# Patient Record
Sex: Female | Born: 1975 | Race: White | Hispanic: No | Marital: Married | Smoking: Never smoker
Health system: Southern US, Community
[De-identification: ages and names within clinical notes are randomized; demographics above are authoritative.]

## PROBLEM LIST (undated history)

## (undated) DIAGNOSIS — O43219 Placenta accreta, unspecified trimester: Secondary | ICD-10-CM

## (undated) DIAGNOSIS — O139 Gestational [pregnancy-induced] hypertension without significant proteinuria, unspecified trimester: Secondary | ICD-10-CM

## (undated) DIAGNOSIS — F419 Anxiety disorder, unspecified: Secondary | ICD-10-CM

## (undated) DIAGNOSIS — I341 Nonrheumatic mitral (valve) prolapse: Secondary | ICD-10-CM

## (undated) DIAGNOSIS — R011 Cardiac murmur, unspecified: Secondary | ICD-10-CM

## (undated) DIAGNOSIS — G039 Meningitis, unspecified: Secondary | ICD-10-CM

## (undated) DIAGNOSIS — S0990XA Unspecified injury of head, initial encounter: Secondary | ICD-10-CM

## (undated) DIAGNOSIS — N200 Calculus of kidney: Secondary | ICD-10-CM

## (undated) DIAGNOSIS — O1490 Unspecified pre-eclampsia, unspecified trimester: Secondary | ICD-10-CM

## (undated) DIAGNOSIS — R42 Dizziness and giddiness: Secondary | ICD-10-CM

## (undated) DIAGNOSIS — N840 Polyp of corpus uteri: Secondary | ICD-10-CM

## (undated) HISTORY — DX: Unspecified injury of head, initial encounter: S09.90XA

## (undated) HISTORY — DX: Unspecified pre-eclampsia, unspecified trimester: O14.90

## (undated) HISTORY — DX: Meningitis, unspecified: G03.9

## (undated) HISTORY — DX: Gestational (pregnancy-induced) hypertension without significant proteinuria, unspecified trimester: O13.9

## (undated) HISTORY — DX: Anxiety disorder, unspecified: F41.9

## (undated) HISTORY — DX: Calculus of kidney: N20.0

## (undated) HISTORY — PX: NO PAST SURGERIES: SHX2092

## (undated) HISTORY — DX: Nonrheumatic mitral (valve) prolapse: I34.1

## (undated) HISTORY — PX: OTHER SURGICAL HISTORY: SHX169

---

## 2000-06-14 ENCOUNTER — Ambulatory Visit: Admission: RE | Admit: 2000-06-14 | Discharge: 2000-06-14 | Payer: Self-pay | Admitting: Family Medicine

## 2003-02-19 ENCOUNTER — Encounter: Admission: RE | Admit: 2003-02-19 | Discharge: 2003-02-19 | Payer: Self-pay | Admitting: Family Medicine

## 2003-02-19 ENCOUNTER — Encounter: Payer: Self-pay | Admitting: Family Medicine

## 2004-12-16 ENCOUNTER — Other Ambulatory Visit: Admission: RE | Admit: 2004-12-16 | Discharge: 2004-12-16 | Payer: Self-pay | Admitting: Family Medicine

## 2005-01-18 ENCOUNTER — Emergency Department (HOSPITAL_COMMUNITY): Admission: EM | Admit: 2005-01-18 | Discharge: 2005-01-18 | Payer: Self-pay | Admitting: Emergency Medicine

## 2005-02-13 ENCOUNTER — Encounter: Admission: RE | Admit: 2005-02-13 | Discharge: 2005-02-13 | Payer: Self-pay | Admitting: Family Medicine

## 2005-02-23 ENCOUNTER — Emergency Department (HOSPITAL_COMMUNITY): Admission: EM | Admit: 2005-02-23 | Discharge: 2005-02-23 | Payer: Self-pay | Admitting: Emergency Medicine

## 2005-04-26 ENCOUNTER — Encounter: Admission: RE | Admit: 2005-04-26 | Discharge: 2005-04-26 | Payer: Self-pay | Admitting: Neurology

## 2006-12-10 ENCOUNTER — Other Ambulatory Visit: Admission: RE | Admit: 2006-12-10 | Discharge: 2006-12-10 | Payer: Self-pay | Admitting: Family Medicine

## 2008-03-08 ENCOUNTER — Other Ambulatory Visit: Admission: RE | Admit: 2008-03-08 | Discharge: 2008-03-08 | Payer: Self-pay | Admitting: Family Medicine

## 2009-05-23 ENCOUNTER — Other Ambulatory Visit: Admission: RE | Admit: 2009-05-23 | Discharge: 2009-05-23 | Payer: Self-pay | Admitting: Family Medicine

## 2010-05-23 ENCOUNTER — Other Ambulatory Visit
Admission: RE | Admit: 2010-05-23 | Discharge: 2010-05-23 | Payer: Self-pay | Source: Home / Self Care | Admitting: Family Medicine

## 2011-11-23 ENCOUNTER — Other Ambulatory Visit: Payer: Self-pay | Admitting: Dermatology

## 2012-07-23 HISTORY — PX: RENAL ARTERY STENT: SHX2321

## 2012-07-27 ENCOUNTER — Emergency Department (HOSPITAL_COMMUNITY)
Admission: EM | Admit: 2012-07-27 | Discharge: 2012-07-27 | Disposition: A | Discharge status: Home / Self Care | Payer: BC Managed Care – PPO | Attending: Emergency Medicine | Admitting: Emergency Medicine

## 2012-07-27 ENCOUNTER — Encounter (HOSPITAL_COMMUNITY): Payer: Self-pay | Admitting: Family Medicine

## 2012-07-27 ENCOUNTER — Emergency Department (HOSPITAL_COMMUNITY): Payer: BC Managed Care – PPO

## 2012-07-27 DIAGNOSIS — Z8679 Personal history of other diseases of the circulatory system: Secondary | ICD-10-CM | POA: Insufficient documentation

## 2012-07-27 DIAGNOSIS — R002 Palpitations: Secondary | ICD-10-CM | POA: Insufficient documentation

## 2012-07-27 DIAGNOSIS — Z3202 Encounter for pregnancy test, result negative: Secondary | ICD-10-CM | POA: Insufficient documentation

## 2012-07-27 DIAGNOSIS — R000 Tachycardia, unspecified: Secondary | ICD-10-CM | POA: Insufficient documentation

## 2012-07-27 HISTORY — DX: Nonrheumatic mitral (valve) prolapse: I34.1

## 2012-07-27 LAB — CBC WITH DIFFERENTIAL/PLATELET
Basophils Relative: 0 % (ref 0–1)
Eosinophils Absolute: 0.1 10*3/uL (ref 0.0–0.7)
Eosinophils Relative: 1 % (ref 0–5)
HCT: 43.1 % (ref 36.0–46.0)
Hemoglobin: 15.4 g/dL — ABNORMAL HIGH (ref 12.0–15.0)
Lymphocytes Relative: 14 % (ref 12–46)
Lymphs Abs: 1.3 10*3/uL (ref 0.7–4.0)
MCH: 32.4 pg (ref 26.0–34.0)
MCHC: 35.7 g/dL (ref 30.0–36.0)
MCV: 90.7 fL (ref 78.0–100.0)
Monocytes Absolute: 0.7 10*3/uL (ref 0.1–1.0)
Monocytes Relative: 8 % (ref 3–12)
Neutrophils Relative %: 77 % (ref 43–77)
Platelets: 241 10*3/uL (ref 150–400)
RDW: 12.5 % (ref 11.5–15.5)
WBC: 9.2 10*3/uL (ref 4.0–10.5)

## 2012-07-27 LAB — BASIC METABOLIC PANEL
BUN: 18 mg/dL (ref 6–23)
CO2: 23 mEq/L (ref 19–32)
Calcium: 9.2 mg/dL (ref 8.4–10.5)
Creatinine, Ser: 0.8 mg/dL (ref 0.50–1.10)
GFR calc Af Amer: >90 mL/min (ref >90)
GFR calc non Af Amer: >90 mL/min (ref >90)
Glucose, Bld: 91 mg/dL (ref 70–99)
Potassium: 3.9 mEq/L (ref 3.5–5.1)
Sodium: 140 mEq/L (ref 135–145)

## 2012-07-27 LAB — POCT I-STAT TROPONIN I: Troponin i, poc: 0.02 ng/mL (ref 0.00–0.08)

## 2012-07-27 LAB — PREGNANCY, URINE: Preg Test, Ur: NEGATIVE

## 2012-07-27 NOTE — ED Provider Notes (Signed)
History     CSN: [id number]  Arrival date & time 07/27/12  1434   None     Chief Complaint  Patient presents with  . Tachycardia    (Consider location/radiation/quality/duration/timing/severity/associated sxs/prior treatment) HPI Comments: Pt is a Runner, broadcasting/film/video. At lunch, pt found herself agitated with the day, sat down to have lunch, pt has a hx with flutters as a result of her MVP, but this time her heart was racing really bad and feeling a pressure in her chest. Described pain as 1/10 then and now 0/10.  Palpations have since subsided.   Denies shortness of breath, nausea, vomiting, headache, visual changes, hx clots, no hx cancer, no hx of surgery, bed rest travel, not taking any exogenous estrogen.   Admits a few days of bilateral calf pain as she was lying in bed,   Pt tried her usual interventions, put fan on her face, calm herself down.   Fam Hx Dad had MI in his 35s. Stented.   Hx with MVP in spring 2001, not replaced, symptomatic less than 5 times a year Last echocardiogram was over 10 years ago.  Pt is not followed by a cardiologist PCP Dr. Ma Hillock. Pt was last there about a year ago. Last EKG was about 10 years.     Past Medical History  Diagnosis Date  . Mitral valve prolapse     History reviewed. No pertinent past surgical history.  History reviewed. No pertinent family history.  History  Substance Use Topics  . Smoking status: Never Smoker   . Smokeless tobacco: Not on file  . Alcohol Use: No    OB History   Grav Para Term Preterm Abortions TAB SAB Ect Mult Living                  Review of Systems  Constitutional: Negative for fever and diaphoresis.  HENT: Negative for neck pain and neck stiffness.   Eyes: Negative for visual disturbance.  Respiratory: Negative for apnea, chest tightness and shortness of breath.   Cardiovascular: Negative for chest pain and palpitations.  Gastrointestinal: Negative for nausea, vomiting, diarrhea and  constipation.  Genitourinary: Negative for dysuria.  Musculoskeletal: Negative for gait problem.  Skin: Negative for rash.  Neurological: Negative for dizziness, weakness, light-headedness, numbness and headaches.    Allergies  Review of patient's allergies indicates not on file.  Home Medications  No current outpatient prescriptions on file.  BP 128/91  Pulse 116  Temp(Src) 98.4 F (36.9 C) (Oral)  Resp 20  SpO2 100%  LMP 06/30/2012  Physical Exam  Nursing note and vitals reviewed. Constitutional: She is oriented to person, place, and time. She appears well-developed and well-nourished. No distress.  HENT:  Head: Normocephalic and atraumatic.  Eyes: EOM are normal. Pupils are equal, round, and reactive to light.  Neck: Normal range of motion. Neck supple.  No meningeal signs  Cardiovascular: Normal rate, regular rhythm and normal heart sounds.  Exam reveals no gallop and no friction rub.   No murmur heard. Pulmonary/Chest: Effort normal and breath sounds normal. No respiratory distress. She has no wheezes. She has no rales. She exhibits no tenderness.  Abdominal: Soft. Bowel sounds are normal. She exhibits no distension. There is no tenderness. There is no rebound and no guarding.  Musculoskeletal: Normal range of motion. She exhibits no edema and no tenderness.  Neurological: She is alert and oriented to person, place, and time. No cranial nerve deficit.  Skin: Skin is warm and dry. She  is not diaphoretic. No erythema.    ED Course  Procedures (including critical care time)  Labs Reviewed - No data to display No results found.  Results for orders placed during the hospital encounter of 07/27/12  CBC WITH DIFFERENTIAL      Result Value Range   WBC 9.2  4.0 - 10.5 K/uL   RBC 4.75  3.87 - 5.11 MIL/uL   Hemoglobin 15.4 (*) 12.0 - 15.0 g/dL   HCT 16.1  09.6 - 04.5 %   MCV 90.7  78.0 - 100.0 fL   MCH 32.4  26.0 - 34.0 pg   MCHC 35.7  30.0 - 36.0 g/dL   RDW 40.9  81.1  - 91.4 %   Platelets 241  150 - 400 K/uL   Neutrophils Relative 77  43 - 77 %   Neutro Abs 7.1  1.7 - 7.7 K/uL   Lymphocytes Relative 14  12 - 46 %   Lymphs Abs 1.3  0.7 - 4.0 K/uL   Monocytes Relative 8  3 - 12 %   Monocytes Absolute 0.7  0.1 - 1.0 K/uL   Eosinophils Relative 1  0 - 5 %   Eosinophils Absolute 0.1  0.0 - 0.7 K/uL   Basophils Relative 0  0 - 1 %   Basophils Absolute 0.0  0.0 - 0.1 K/uL  BASIC METABOLIC PANEL      Result Value Range   Sodium 140  135 - 145 mEq/L   Potassium 3.9  3.5 - 5.1 mEq/L   Chloride 105  96 - 112 mEq/L   CO2 23  19 - 32 mEq/L   Glucose, Bld 91  70 - 99 mg/dL   BUN 18  6 - 23 mg/dL   Creatinine, Ser 7.82  0.50 - 1.10 mg/dL   Calcium 9.2  8.4 - 95.6 mg/dL   GFR calc non Af Amer >90  >90 mL/min   GFR calc Af Amer >90  >90 mL/min  PREGNANCY, URINE      Result Value Range   Preg Test, Ur NEGATIVE  NEGATIVE  D-DIMER, QUANTITATIVE      Result Value Range   D-Dimer, Quant 0.33  0.00 - 0.48 ug/mL-FEU  POCT I-STAT TROPONIN I      Result Value Range   Troponin i, poc 0.02  0.00 - 0.08 ng/mL   Comment 3              Dg Chest 2 View  07/27/2012  *RADIOLOGY REPORT*  Clinical Data: Chest pain, heart fluttering, history mitral valve prolapse  CHEST - 2 VIEW  Comparison: None  Findings: Normal heart size, mediastinal contours, and pulmonary vascularity. Minimal peribronchial thickening and hyperinflation. No acute infiltrate, pleural effusion or pneumothorax. No acute osseous findings.  IMPRESSION: Minimal bronchitic changes without infiltrate.   Original Report Authenticated By: Ulyses Southward, M.D.       Date: 07/27/2012  Rate: 112  Rhythm: sinus tachycardia  QRS Axis: normal  Intervals: normal  ST/T Wave abnormalities: normal  Conduction Disutrbances: none  Narrative Interpretation: abnormal EKG  Old EKG Reviewed: None available   Diagnosis: palpitations, tachycardia    MDM  Pt denies a history of travel, immobilization, surgery, fevers,  cancer, oral contraceptives or hormone use, swelling of the legs. The patient has no history of venous thromboembolism. Pt notes bilateral calf pain for last week. Pt is tachycardic, borderline tachypnic. Will get d-dimer.   Hx of MVP, palpitations, and tachycardia. Not followed by cardiologist. Want to  r/o ACS. Pt in no pain, NAD at this time.  Lab work, EKG, vitals, and imaging not are unimpressive.  On re-evaluation, pt ambulates asymptomatically though still mildly tachycardic to 106. Stressed to pt the importance of following up with a cardiologist. Pt states she will follow up with her dad's cardiologist, Dr. Viann Fish.   At this time there does not appear to be any evidence of an acute emergency medical condition and the patient appears stable for discharge with appropriate outpatient follow up.Diagnosis was discussed with patient who verbalizes understanding and is agreeable to discharge. Pt case discussed with and seen by Dr. Bebe Shaggy who agrees with my plan.     Glade Nurse, PA-C 07/27/12 2350

## 2012-07-27 NOTE — ED Notes (Signed)
Pt return from xray.

## 2012-07-27 NOTE — ED Notes (Signed)
Per EMS, pt having tachycardia and fluttering in chest. HR 124 when EMS arrived and pt stated feeling better. sts was higher. IV started. BP 142/90. Pt pulse 112 now.

## 2012-07-29 NOTE — ED Provider Notes (Signed)
Medical screening examination/treatment/procedure(s) were conducted as a shared visit with non-physician practitioner(s) and myself.  I personally evaluated the patient during the encounter  I reviewed ekg, sinus tach only Pt well appearing, agrees to f/u with cardiology this week.  No meds ordered at this time  Joya Gaskins, MD 07/29/12 [phone]

## 2012-08-13 ENCOUNTER — Encounter (HOSPITAL_BASED_OUTPATIENT_CLINIC_OR_DEPARTMENT_OTHER): Payer: Self-pay | Admitting: *Deleted

## 2012-08-13 ENCOUNTER — Emergency Department (HOSPITAL_BASED_OUTPATIENT_CLINIC_OR_DEPARTMENT_OTHER)
Admission: EM | Admit: 2012-08-13 | Discharge: 2012-08-13 | Disposition: A | Discharge status: Home / Self Care | Payer: BC Managed Care – PPO | Attending: Emergency Medicine | Admitting: Emergency Medicine

## 2012-08-13 ENCOUNTER — Emergency Department (HOSPITAL_BASED_OUTPATIENT_CLINIC_OR_DEPARTMENT_OTHER): Payer: BC Managed Care – PPO

## 2012-08-13 DIAGNOSIS — Z8679 Personal history of other diseases of the circulatory system: Secondary | ICD-10-CM | POA: Insufficient documentation

## 2012-08-13 DIAGNOSIS — N200 Calculus of kidney: Secondary | ICD-10-CM | POA: Insufficient documentation

## 2012-08-13 DIAGNOSIS — Z87442 Personal history of urinary calculi: Secondary | ICD-10-CM | POA: Insufficient documentation

## 2012-08-13 DIAGNOSIS — Z3202 Encounter for pregnancy test, result negative: Secondary | ICD-10-CM | POA: Insufficient documentation

## 2012-08-13 HISTORY — DX: Calculus of kidney: N20.0

## 2012-08-13 HISTORY — DX: Dizziness and giddiness: R42

## 2012-08-13 LAB — URINALYSIS, ROUTINE W REFLEX MICROSCOPIC
Bilirubin Urine: NEGATIVE
Glucose, UA: NEGATIVE mg/dL
Hgb urine dipstick: NEGATIVE
Ketones, ur: NEGATIVE mg/dL
Nitrite: NEGATIVE
Specific Gravity, Urine: 1.019 (ref 1.005–1.030)
pH: 6.5 (ref 5.0–8.0)

## 2012-08-13 LAB — PREGNANCY, URINE: Preg Test, Ur: NEGATIVE

## 2012-08-13 MED ORDER — TAMSULOSIN HCL 0.4 MG PO CAPS: 0.4000 mg | ORAL_CAPSULE | Freq: Every day | ORAL | Status: DC

## 2012-08-13 MED ORDER — OXYCODONE-ACETAMINOPHEN 5-325 MG PO TABS: 2.0000 | ORAL_TABLET | ORAL | Status: DC | PRN

## 2012-08-13 NOTE — ED Notes (Signed)
MD at bedside. 

## 2012-08-13 NOTE — ED Notes (Addendum)
Pt c/o left flank pain onset 1700. Denies other s/s. Hx stone 13-14 yrs ago. No pain with same. Had similar pain a few weeks ago, but went away and returned.

## 2012-08-13 NOTE — ED Provider Notes (Signed)
History    This chart was scribed for Geoffery Lyons, MD scribed by Magnus Sinning. The patient was seen in room MH07/MH07 at 21:00    CSN: [id number]  Arrival date & time 08/13/12  4098    Chief Complaint  Patient presents with  . Flank Pain    (Consider location/radiation/quality/duration/timing/severity/associated sxs/prior treatment) HPI Martha Mason is a 37 y.o. female who presents to the Emergency Department complaining of waxing and waning moderate left sided flank pain, onset 3 hours ago that started at her right side initially. She reports that a few weeks ago she had similar sxs and she notes 14 years ago they found rice sized kidney stone that she says she never was able to feel. She denies any fever, chills, vomiting, dysuria, or other urinary sxs.  The patient states that she just got out of the hospital 17 days ago. She says she was informed that she had mitral valve prolapse and that she might have also been informed that she had SVT, but she is unsure. She explains she has a follow-up appointment on 08/31/12 and is schedule for an ECHO following.  Past Medical History  Diagnosis Date  . Mitral valve prolapse   . Kidney stone   . Vertigo     History reviewed. No pertinent past surgical history.  History reviewed. No pertinent family history.  History  Substance Use Topics  . Smoking status: Never Smoker   . Smokeless tobacco: Not on file  . Alcohol Use: No   Review of Systems  Constitutional: Negative for fever and chills.  Gastrointestinal: Negative for nausea and vomiting.  Genitourinary: Positive for flank pain. Negative for dysuria.  All other systems reviewed and are negative.    Allergies  Hydrocodone and Keflex  Home Medications  No current outpatient prescriptions on file.  BP 145/86  Pulse 98  Temp(Src) 98.1 F (36.7 C) (Oral)  Resp 20  Ht 5\' 9"  (1.753 m)  Wt 142 lb (64.411 kg)  BMI 20.96 kg/m2  SpO2 97%  LMP 07/29/2012  Physical  Exam  Nursing note and vitals reviewed. Constitutional: She is oriented to person, place, and time. She appears well-developed and well-nourished. No distress.  HENT:  Head: Normocephalic and atraumatic.  Eyes: Conjunctivae and EOM are normal.  Neck: Neck supple. No tracheal deviation present.  Cardiovascular: Normal rate, regular rhythm and normal heart sounds.   No murmur heard. Pulmonary/Chest: Effort normal and breath sounds normal. No respiratory distress. She has no wheezes. She has no rales.  Abdominal: Soft. Bowel sounds are normal. She exhibits no distension. There is tenderness. There is CVA tenderness. There is no rebound and no guarding.  Mild to moderate left sided CVA tenderness, but otherwise unremarkable abd exam.   Musculoskeletal: Normal range of motion.  Neurological: She is alert and oriented to person, place, and time. No sensory deficit.  Skin: Skin is warm and dry. No rash noted.  Psychiatric: She has a normal mood and affect. Her behavior is normal.    ED Course  Procedures (including critical care time) DIAGNOSTIC STUDIES: Oxygen Saturation is 97% on room air, normal by my interpretation.    COORDINATION OF CARE: 21:02: Physical exam performed and intent provided to order CT scan. Pt is agreeable.    Labs Reviewed  URINALYSIS, ROUTINE W REFLEX MICROSCOPIC  PREGNANCY, URINE   Ct Abdomen Pelvis Wo Contrast  08/13/2012  *RADIOLOGY REPORT*  Clinical Data: Left flank pain.  CT ABDOMEN AND PELVIS WITHOUT CONTRAST  Technique:  Multidetector CT imaging of the abdomen and pelvis was performed following the standard protocol without intravenous contrast.  Comparison: None.  Findings: Limited images through the lung bases demonstrate no significant appreciable abnormality. The heart size is within normal limits. No pleural or pericardial effusion.  Organ abnormality/lesion detection is limited in the absence of intravenous contrast. Within this limitation, unremarkable  liver, spleen, adrenal glands.  Contracted gallbladder.  No biliary ductal dilatation.  Limited pancreatic evaluation, no ductal dilatation.  Symmetric renal size.  There is a nonobstructing left renal stone. Mild left hydroureteronephrosis to the level of a 7 x 3 mm stone within the proximal left ureter.  No CT evidence for colitis.  Appendix not identified.  No right lower quadrant inflammation.  Small bowel loops are normal course and caliber.  No free intraperitoneal air or fluid.  No lymphadenopathy.  Normal caliber aorta and branch vessels.  Thin-walled bladder.  Nonspecific appearance to the uterus with questionable fibroid. Nonspecific appearance to the adnexa.  No acute osseous finding.  IMPRESSION: Mild left hydroureteronephrosis to the level of a 3 x 7 mm proximal left ureteral stone.  Additional nonobstructing left renal stone.  Otherwise as above.   Original Report Authenticated By: Jearld Lesch, M.D.      No diagnosis found.    MDM  CT reveals a moderate-sized renal calculus in the left proximal ureter.  She appears clinically well and comfortable.  The urine is clear without evidence for infection.  She will be given pain medications and flomax.  I have given the contact information for [facility] and she is to call Monday to arrange follow up if not improving.  She is to return prn for fever, worsening pain.    I personally performed the services described in this documentation, which was scribed in my presence. The recorded information has been reviewed and is accurate.           Geoffery Lyons, MD 08/14/12 [phone]

## 2012-08-15 DIAGNOSIS — N201 Calculus of ureter: Secondary | ICD-10-CM | POA: Insufficient documentation

## 2012-08-15 DIAGNOSIS — N200 Calculus of kidney: Secondary | ICD-10-CM | POA: Insufficient documentation

## 2012-08-15 DIAGNOSIS — N1330 Unspecified hydronephrosis: Secondary | ICD-10-CM | POA: Insufficient documentation

## 2012-08-15 DIAGNOSIS — R109 Unspecified abdominal pain: Secondary | ICD-10-CM | POA: Insufficient documentation

## 2012-08-16 DIAGNOSIS — I341 Nonrheumatic mitral (valve) prolapse: Secondary | ICD-10-CM | POA: Insufficient documentation

## 2013-05-26 ENCOUNTER — Other Ambulatory Visit: Payer: Self-pay | Admitting: Family Medicine

## 2013-05-26 ENCOUNTER — Ambulatory Visit
Admission: RE | Admit: 2013-05-26 | Discharge: 2013-05-26 | Disposition: A | Discharge status: Home / Self Care | Payer: BC Managed Care – PPO | Source: Ambulatory Visit | Attending: Family Medicine | Admitting: Family Medicine

## 2013-05-26 DIAGNOSIS — M659 Synovitis and tenosynovitis, unspecified: Secondary | ICD-10-CM

## 2013-06-25 DIAGNOSIS — N840 Polyp of corpus uteri: Secondary | ICD-10-CM | POA: Diagnosis present

## 2013-06-26 ENCOUNTER — Other Ambulatory Visit: Payer: Self-pay | Admitting: Obstetrics & Gynecology

## 2013-06-28 NOTE — H&P (Addendum)
Martha Mason is an 38 y.o. female G0. Office sonogram noted a 1.4 cm endometrial polyp. Patient has tried several cycles of infertility treatment and was complaining of abnormal vaginal spotting in last few months. Office sonohysterogram noted a large endometrial polyp and was advised removal since it can affect fertility.  Nl Paps, no STDs, no breast complaints. No coitus since period.   No LMP recorded.    Past Medical History  Diagnosis Date  . Mitral valve prolapse   . Kidney stone   . Vertigo     No past surgical history on file.  No family history on file.  Social History:  reports that she has never smoked. She does not have any smokeless tobacco history on file. She reports that she does not drink alcohol. Her drug history is not on file.  Allergies:  Allergies  Allergen Reactions  . Hydrocodone Other (See Comments)    Skin crawls  . Keflex [Cephalexin] Palpitations    Patient unsure of allergy     No prescriptions prior to admission    Review of Systems  Constitutional: Negative for fever.  Eyes: Negative for blurred vision.  Respiratory: Negative for cough.   Cardiovascular: Negative for chest pain.  Gastrointestinal: Negative for heartburn.  Genitourinary: Negative for dysuria.  Skin: Negative for rash.  Neurological: Negative for headaches.    There were no vitals taken for this visit. Physical Exam A&O x 3, no acute distress. Pleasant HEENT neg, no thyromegaly Lungs CTA bilat CV RRR, S1S2 normal Abdo soft, non tender, non acute Extr no edema/ tenderness Pelvic Normal uterus, cervix, adnexa.    No results found for this or any previous visit (from the past 24 hour(s)).  No results found.  Assessment/Plan: 38 yo female, G0 with endometrial polyp, here for hysteroscopic polypectomy.Plan to use Truclear since no electric energy use with keep cavity from risk of scarring.  Risks/complications of surgery reviewed incl infection, bleeding, damage  to internal organs including bladder, bowels, ureters, blood vessels, other risks from anesthesia, VTE and delayed complications of any surgery, complications in future surgery reviewed.   Martha Mason 06/28/2013, 12:47 PM  Addendum- 06/30/2013 H&P reviewed, no interval change, agree with above note and plan.  Martha Gadson, MD

## 2013-06-29 MED ORDER — DOXYCYCLINE HYCLATE 100 MG IV SOLR
100.0000 mg | Freq: Once | INTRAVENOUS | Status: DC
  Filled 2013-06-29: qty 100

## 2013-06-29 MED ORDER — DOXYCYCLINE HYCLATE 100 MG IV SOLR
100.0000 mg | Freq: Once | INTRAVENOUS | Status: AC
  Administered 2013-06-30: 100 mg via INTRAVENOUS
  Filled 2013-06-29: qty 100

## 2013-06-30 ENCOUNTER — Encounter (HOSPITAL_COMMUNITY): Payer: BC Managed Care – PPO | Admitting: Anesthesiology

## 2013-06-30 ENCOUNTER — Encounter (HOSPITAL_COMMUNITY): Payer: Self-pay | Admitting: Obstetrics & Gynecology

## 2013-06-30 ENCOUNTER — Ambulatory Visit (HOSPITAL_COMMUNITY)
Admission: RE | Admit: 2013-06-30 | Discharge: 2013-06-30 | Disposition: A | Discharge status: Home / Self Care | Payer: BC Managed Care – PPO | Source: Ambulatory Visit | Attending: Obstetrics & Gynecology | Admitting: Obstetrics & Gynecology

## 2013-06-30 ENCOUNTER — Ambulatory Visit (HOSPITAL_COMMUNITY): Payer: BC Managed Care – PPO | Admitting: Anesthesiology

## 2013-06-30 ENCOUNTER — Encounter (HOSPITAL_COMMUNITY)
Admission: RE | Disposition: A | Discharge status: Home / Self Care | Payer: Self-pay | Source: Ambulatory Visit | Attending: Obstetrics & Gynecology

## 2013-06-30 DIAGNOSIS — I059 Rheumatic mitral valve disease, unspecified: Secondary | ICD-10-CM | POA: Insufficient documentation

## 2013-06-30 DIAGNOSIS — N840 Polyp of corpus uteri: Secondary | ICD-10-CM | POA: Diagnosis present

## 2013-06-30 DIAGNOSIS — R42 Dizziness and giddiness: Secondary | ICD-10-CM | POA: Insufficient documentation

## 2013-06-30 DIAGNOSIS — R011 Cardiac murmur, unspecified: Secondary | ICD-10-CM | POA: Insufficient documentation

## 2013-06-30 HISTORY — DX: Polyp of corpus uteri: N84.0

## 2013-06-30 HISTORY — PX: DILATATION & CURETTAGE/HYSTEROSCOPY WITH TRUECLEAR: SHX6353

## 2013-06-30 HISTORY — DX: Cardiac murmur, unspecified: R01.1

## 2013-06-30 LAB — CBC
HCT: 41.7 % (ref 36.0–46.0)
Hemoglobin: 14.2 g/dL (ref 12.0–15.0)
MCH: 31.6 pg (ref 26.0–34.0)
MCHC: 34.1 g/dL (ref 30.0–36.0)
MCV: 92.9 fL (ref 78.0–100.0)
PLATELETS: 231 10*3/uL (ref 150–400)
RBC: 4.49 MIL/uL (ref 3.87–5.11)
RDW: 12.7 % (ref 11.5–15.5)
WBC: 6.7 10*3/uL (ref 4.0–10.5)

## 2013-06-30 LAB — PREGNANCY, URINE: PREG TEST UR: NEGATIVE

## 2013-06-30 SURGERY — DILATATION & CURETTAGE/HYSTEROSCOPY WITH TRUCLEAR
Anesthesia: General | Site: Vagina

## 2013-06-30 MED ORDER — KETOROLAC TROMETHAMINE 30 MG/ML IJ SOLN: 15.0000 mg | Freq: Once | INTRAMUSCULAR | Status: DC | PRN

## 2013-06-30 MED ORDER — FENTANYL CITRATE 0.05 MG/ML IJ SOLN
INTRAMUSCULAR | Status: AC
  Filled 2013-06-30: qty 5

## 2013-06-30 MED ORDER — METOCLOPRAMIDE HCL 5 MG/ML IJ SOLN: 10.0000 mg | Freq: Once | INTRAMUSCULAR | Status: DC | PRN

## 2013-06-30 MED ORDER — FENTANYL CITRATE 0.05 MG/ML IJ SOLN: 25.0000 ug | INTRAMUSCULAR | Status: DC | PRN

## 2013-06-30 MED ORDER — IBUPROFEN 600 MG PO TABS: 600.0000 mg | ORAL_TABLET | Freq: Four times a day (QID) | ORAL | Status: DC | PRN

## 2013-06-30 MED ORDER — LIDOCAINE HCL (CARDIAC) 20 MG/ML IV SOLN
INTRAVENOUS | Status: DC | PRN
Start: 2013-06-30 — End: 2013-06-30
  Administered 2013-06-30: 50 mg via INTRAVENOUS

## 2013-06-30 MED ORDER — MIDAZOLAM HCL 2 MG/2ML IJ SOLN
INTRAMUSCULAR | Status: DC | PRN
  Administered 2013-06-30: 2 mg via INTRAVENOUS

## 2013-06-30 MED ORDER — KETOROLAC TROMETHAMINE 30 MG/ML IJ SOLN
INTRAMUSCULAR | Status: DC | PRN
  Administered 2013-06-30: 30 mg via INTRAVENOUS

## 2013-06-30 MED ORDER — PROPOFOL 10 MG/ML IV BOLUS
INTRAVENOUS | Status: DC | PRN
  Administered 2013-06-30: 80 mg via INTRAVENOUS

## 2013-06-30 MED ORDER — MIDAZOLAM HCL 2 MG/2ML IJ SOLN
INTRAMUSCULAR | Status: AC
  Filled 2013-06-30: qty 2

## 2013-06-30 MED ORDER — LIDOCAINE HCL 1 % IJ SOLN
INTRAMUSCULAR | Status: AC
  Filled 2013-06-30: qty 20

## 2013-06-30 MED ORDER — ONDANSETRON HCL 4 MG/2ML IJ SOLN
INTRAMUSCULAR | Status: DC | PRN
  Administered 2013-06-30: 4 mg via INTRAVENOUS

## 2013-06-30 MED ORDER — MEPERIDINE HCL 25 MG/ML IJ SOLN: 6.2500 mg | INTRAMUSCULAR | Status: DC | PRN

## 2013-06-30 MED ORDER — DEXAMETHASONE SODIUM PHOSPHATE 10 MG/ML IJ SOLN
INTRAMUSCULAR | Status: DC | PRN
  Administered 2013-06-30: 10 mg via INTRAVENOUS

## 2013-06-30 MED ORDER — DEXAMETHASONE SODIUM PHOSPHATE 10 MG/ML IJ SOLN
INTRAMUSCULAR | Status: AC
  Filled 2013-06-30: qty 1

## 2013-06-30 MED ORDER — ONDANSETRON HCL 4 MG/2ML IJ SOLN
INTRAMUSCULAR | Status: AC
  Filled 2013-06-30: qty 2

## 2013-06-30 MED ORDER — KETOROLAC TROMETHAMINE 30 MG/ML IJ SOLN
INTRAMUSCULAR | Status: AC
  Filled 2013-06-30: qty 1

## 2013-06-30 MED ORDER — FENTANYL CITRATE 0.05 MG/ML IJ SOLN
INTRAMUSCULAR | Status: DC | PRN
  Administered 2013-06-30: 100 ug via INTRAVENOUS

## 2013-06-30 MED ORDER — LIDOCAINE HCL (CARDIAC) 20 MG/ML IV SOLN
INTRAVENOUS | Status: AC
  Filled 2013-06-30: qty 5

## 2013-06-30 MED ORDER — LACTATED RINGERS IV SOLN
INTRAVENOUS | Status: DC
  Administered 2013-06-30 (×2): via INTRAVENOUS

## 2013-06-30 MED ORDER — PROPOFOL 10 MG/ML IV EMUL
INTRAVENOUS | Status: AC
  Filled 2013-06-30: qty 20

## 2013-06-30 SURGICAL SUPPLY — 21 items
BLADE INCISOR TRUC PLUS 2.9 (ABLATOR) IMPLANT
CANISTER SUCT 3000ML (MISCELLANEOUS) ×3 IMPLANT
CANISTERS HI-FLOW 3000CC (CANNISTER) IMPLANT
CATH ROBINSON RED A/P 16FR (CATHETERS) ×3 IMPLANT
CLOTH BEACON ORANGE TIMEOUT ST (SAFETY) ×3 IMPLANT
CONTAINER PREFILL 10% NBF 60ML (FORM) ×6 IMPLANT
DRAPE HYSTEROSCOPY (DRAPE) ×3 IMPLANT
DRSG TELFA 3X8 NADH (GAUZE/BANDAGES/DRESSINGS) ×3 IMPLANT
GLOVE BIO SURGEON STRL SZ7 (GLOVE) ×3 IMPLANT
GLOVE BIOGEL PI IND STRL 7.0 (GLOVE) ×1 IMPLANT
GLOVE BIOGEL PI INDICATOR 7.0 (GLOVE) ×2
GOWN STRL REUS W/TWL LRG LVL3 (GOWN DISPOSABLE) ×6 IMPLANT
INCISOR TRUC PLUS BLADE 2.9 (ABLATOR) ×3
KIT HYSTEROSCOPY TRUCLEAR (ABLATOR) IMPLANT
MORCELLATOR RECIP TRUCLEAR 4.0 (ABLATOR) IMPLANT
NEEDLE SPNL 22GX3.5 QUINCKE BK (NEEDLE) ×3 IMPLANT
PACK VAGINAL MINOR WOMEN LF (CUSTOM PROCEDURE TRAY) ×3 IMPLANT
PAD OB MATERNITY 4.3X12.25 (PERSONAL CARE ITEMS) ×3 IMPLANT
SYR CONTROL 10ML LL (SYRINGE) ×3 IMPLANT
TOWEL OR 17X24 6PK STRL BLUE (TOWEL DISPOSABLE) ×6 IMPLANT
WATER STERILE IRR 1000ML POUR (IV SOLUTION) ×3 IMPLANT

## 2013-06-30 NOTE — Discharge Instructions (Signed)
Post Anesthesia Home Care Instructions  Activity: Get plenty of rest for the remainder of the day. A responsible adult should stay with you for 24 hours following the procedure.  For the next 24 hours, DO NOT: -Drive a car -Advertising copywriter -Drink alcoholic beverages -Take any medication unless instructed by your physician -Make any legal decisions or sign important papers.  Meals: Start with liquid foods such as gelatin or soup. Progress to regular foods as tolerated. Avoid greasy, spicy, heavy foods. If nausea and/or vomiting occur, drink only clear liquids until the nausea and/or vomiting subsides. Call your physician if vomiting continues.  Special Instructions/Symptoms: Your throat may feel dry or sore from the anesthesia or the breathing tube placed in your throat during surgery. If this causes discomfort, gargle with warm salt water. The discomfort should disappear within 24 hours.   YOU MAY TAKE IBUPROFEN STARTING AT 9:00 PM TONIGHT.  Hysteroscopy Hysteroscopy is a procedure used for looking inside the womb (uterus). It may be done for various reasons, including:  To evaluate abnormal bleeding, fibroid (benign, noncancerous) tumors, polyps, scar tissue (adhesions), and possibly cancer of the uterus.  To look for lumps (tumors) and other uterine growths.  To look for causes of why a woman cannot get pregnant (infertility), causes of recurrent loss of pregnancy (miscarriages), or a lost intrauterine device (IUD).  To perform a sterilization by blocking the fallopian tubes from inside the uterus. In this procedure, a thin, flexible tube with a tiny light and camera on the end of it (hysteroscope) is used to look inside the uterus. A hysteroscopy should be done right after a menstrual period to be sure you are not pregnant. LET [facility] CARE PROVIDER KNOW ABOUT:   Any allergies you have.  All medicines you are taking, including vitamins, herbs, eye drops, creams, and  over-the-counter medicines.  Previous problems you or members of your family have had with the use of anesthetics.  Any blood disorders you have.  Previous surgeries you have had.  Medical conditions you have. RISKS AND COMPLICATIONS  Generally, this is a safe procedure. However, as with any procedure, complications can occur. Possible complications include:  Putting a hole in the uterus.  Excessive bleeding.  Infection.  Damage to the cervix.  Injury to other organs.  Allergic reaction to medicines.  Too much fluid used in the uterus for the procedure. BEFORE THE PROCEDURE   Ask your health care provider about changing or stopping any regular medicines.  Do not take aspirin or blood thinners for 1 week before the procedure, or as directed by your health care provider. These can cause bleeding.  If you smoke, do not smoke for 2 weeks before the procedure.  In some cases, a medicine is placed in the cervix the day before the procedure. This medicine makes the cervix have a larger opening (dilate). This makes it easier for the instrument to be inserted into the uterus during the procedure.  Do not eat or drink anything for at least 8 hours before the surgery.  Arrange for someone to take you home after the procedure. PROCEDURE   You may be given a medicine to relax you (sedative). You may also be given one of the following:  A medicine that numbs the area around the cervix (local anesthetic).  A medicine that makes you sleep through the procedure (general anesthetic).  The hysteroscope is inserted through the vagina into the uterus. The camera on the hysteroscope sends a picture to a  TV screen. This gives the surgeon a good view inside the uterus.  During the procedure, air or a liquid is put into the uterus, which allows the surgeon to see better.  Sometimes, tissue is gently scraped from inside the uterus. These tissue samples are sent to a lab for testing. AFTER  THE PROCEDURE   If you had a general anesthetic, you may be groggy for a couple hours after the procedure.  If you had a local anesthetic, you will be able to go home as soon as you are stable and feel ready.  You may have some cramping. This normally lasts for a couple days.  You may have bleeding, which varies from light spotting for a few days to menstrual-like bleeding for 3 7 days. This is normal.  If your test results are not back during the visit, make an appointment with your health care provider to find out the results. Document Released: 08/17/2000 Document Revised: 03/01/2013 Document Reviewed: 12/08/2012 [facility]ExitCare Patient Information 2014 [place], [place].

## 2013-06-30 NOTE — Transfer of Care (Signed)
Immediate Anesthesia Transfer of Care Note  Patient: Martha Mason  Procedure(s) Performed: Procedure(s): DILATATION & HYSTEROSCOPY/POLYPECTOMY WITH TRUCLEAR (N/A)  Patient Location: PACU  Anesthesia Type:General  Level of Consciousness: awake  Airway & Oxygen Therapy: Patient Spontanous Breathing  Post-op Assessment: Report given to PACU RN  Post vital signs: stable  Filed Vitals:   06/30/13 1347  BP: 139/76  Pulse: 103  Temp: 36.8 C    Complications: No apparent anesthesia complications

## 2013-06-30 NOTE — Op Note (Signed)
Preoperative diagnosis: Endometrial polyp  Postop diagnosis: as above.  Procedure: Hysteroscopic polypectomy with True clear device Anesthesia General via LMA and paracervical block Surgeon: Shea EvansVaishali Io Dieujuste, MD  Assistant: none IV fluids : 1 lit LR Estimated blood loss : 10 cc Urine output: straight catheter preop 50 cc  Complications none  Condition stable  Disposition PACU  Specimen: Endometrial polyp fragements  Procedure  Indication: large endometrial polyp noted on office sonogram. Patient was counseled on risks/ complications including infection, bleeding, damage to internal organs, she understood and agrees, gave informed written consent.  Patient was brought to the operating room with IV running. Time out was carried out. She received preop 100 mg Doxycycline. She underwent general anesthesia via LMA without complications. She was given dorsolithotomy position. Parts were prepped and draped in standard fashion. Bladder was catheterized once. Bimanual exam revealed uterus to be anteverted and normal size. Speculum was placed and cervix was grasped with single-tooth tenaculum. Cervical block with 10 cc 1% plain Xylocaine given. The uterus was sounded to 8 cm. Cervical os was already dilated from Cytotec prep and passed 17# dilator easily. Hysteroscope was introduced in the uterine cavity under vision, using saline for irrigation. Large endometrial polyp noted from anterior wall of the uterus filling up the cavity and came into lower segment and upper cervical canal.  Hysteroscopic polypectomy was performed with Truclear under vision and tissue was suctioned out at the same time, specimen sent to path. Hysteroscope was removed. Cavity was hemostatic.  Fluid deficit 80 cc.  All counts are correct x2. No complications. Patient was made supine dorsal anesthesia and brought to the recovery room in stable condition.  Patient will be discharged home today. Follow up in 2 weeks in office. Warning  signs of infection and excessive bleeding reviewed.   V.Spero Gunnels, MD.

## 2013-06-30 NOTE — Anesthesia Preprocedure Evaluation (Addendum)
Anesthesia Evaluation  Patient identified by MRN, date of birth, ID band Patient awake    Reviewed: Allergy & Precautions, H&P , NPO status , Patient's Chart, lab work & pertinent test results, reviewed documented beta blocker date and time   History of Anesthesia Complications Negative for: history of anesthetic complications  Airway Mallampati: II TM Distance: <3 FB Neck ROM: full    Dental  (+) Teeth Intact   Pulmonary neg pulmonary ROS,  breath sounds clear to auscultation  Pulmonary exam normal       Cardiovascular + Valvular Problems/Murmurs MVP Rhythm:regular Rate:Normal     Neuro/Psych vertigo negative psych ROS   GI/Hepatic negative GI ROS, Neg liver ROS,   Endo/Other  negative endocrine ROS  Renal/GU H/o kidney stone     Musculoskeletal   Abdominal   Peds  Hematology negative hematology ROS (+)   Anesthesia Other Findings   Reproductive/Obstetrics negative OB ROS                          Anesthesia Physical Anesthesia Plan  ASA: II  Anesthesia Plan: General LMA   Post-op Pain Management:    Induction:   Airway Management Planned:   Additional Equipment:   Intra-op Plan:   Post-operative Plan:   Informed Consent: I have reviewed the patients History and Physical, chart, labs and discussed the procedure including the risks, benefits and alternatives for the proposed anesthesia with the patient or authorized representative who has indicated his/her understanding and acceptance.   Dental Advisory Given  Plan Discussed with: CRNA and Surgeon  Anesthesia Plan Comments:         Anesthesia Quick Evaluation

## 2013-06-30 NOTE — Anesthesia Postprocedure Evaluation (Signed)
  Anesthesia Post-op Note  Anesthesia Post Note  Patient: Martha LivingJennifer W Mason  Procedure(s) Performed: Procedure(s) (LRB): DILATATION & HYSTEROSCOPY/POLYPECTOMY WITH TRUCLEAR (N/A)  Anesthesia type: General  Patient location: PACU  Post pain: Pain level controlled  Post assessment: Post-op Vital signs reviewed  Last Vitals:  Filed Vitals:   06/30/13 1725  BP: 140/79  Pulse: 98  Temp: 36.3 C  Resp: 16    Post vital signs: Reviewed  Level of consciousness: sedated  Complications: No apparent anesthesia complications

## 2013-07-03 ENCOUNTER — Encounter (HOSPITAL_COMMUNITY): Payer: Self-pay | Admitting: Obstetrics & Gynecology

## 2014-06-06 ENCOUNTER — Other Ambulatory Visit: Payer: Self-pay | Admitting: Physician Assistant

## 2014-06-06 DIAGNOSIS — R1013 Epigastric pain: Secondary | ICD-10-CM

## 2014-06-11 ENCOUNTER — Ambulatory Visit
Admission: RE | Admit: 2014-06-11 | Discharge: 2014-06-11 | Disposition: A | Discharge status: Home / Self Care | Payer: BC Managed Care – PPO | Source: Ambulatory Visit | Attending: Physician Assistant | Admitting: Physician Assistant

## 2014-06-11 DIAGNOSIS — R1013 Epigastric pain: Secondary | ICD-10-CM

## 2014-07-10 ENCOUNTER — Other Ambulatory Visit: Payer: Self-pay | Admitting: Gastroenterology

## 2014-12-12 ENCOUNTER — Emergency Department (HOSPITAL_COMMUNITY)
Admission: EM | Admit: 2014-12-12 | Discharge: 2014-12-12 | Disposition: A | Discharge status: Home / Self Care | Payer: BC Managed Care – PPO | Attending: Emergency Medicine | Admitting: Emergency Medicine

## 2014-12-12 ENCOUNTER — Encounter (HOSPITAL_COMMUNITY): Payer: Self-pay

## 2014-12-12 DIAGNOSIS — Z8679 Personal history of other diseases of the circulatory system: Secondary | ICD-10-CM | POA: Diagnosis not present

## 2014-12-12 DIAGNOSIS — H8110 Benign paroxysmal vertigo, unspecified ear: Secondary | ICD-10-CM | POA: Insufficient documentation

## 2014-12-12 DIAGNOSIS — Z87442 Personal history of urinary calculi: Secondary | ICD-10-CM | POA: Insufficient documentation

## 2014-12-12 DIAGNOSIS — Z79899 Other long term (current) drug therapy: Secondary | ICD-10-CM | POA: Insufficient documentation

## 2014-12-12 DIAGNOSIS — R011 Cardiac murmur, unspecified: Secondary | ICD-10-CM | POA: Diagnosis not present

## 2014-12-12 DIAGNOSIS — Z8742 Personal history of other diseases of the female genital tract: Secondary | ICD-10-CM | POA: Insufficient documentation

## 2014-12-12 DIAGNOSIS — R1111 Vomiting without nausea: Secondary | ICD-10-CM | POA: Diagnosis present

## 2014-12-12 MED ORDER — CARBAMIDE PEROXIDE 6.5 % OT SOLN
5.0000 [drp] | Freq: Once | OTIC | Status: AC
  Administered 2014-12-12: 5 [drp] via OTIC
  Filled 2014-12-12: qty 15

## 2014-12-12 MED ORDER — DIAZEPAM 5 MG/ML IJ SOLN
2.5000 mg | Freq: Once | INTRAMUSCULAR | Status: AC
  Administered 2014-12-12: 2.5 mg via INTRAVENOUS
  Filled 2014-12-12: qty 2

## 2014-12-12 MED ORDER — HYDROGEN PEROXIDE 3 % EX SOLN
CUTANEOUS | Status: AC
  Filled 2014-12-12: qty 473

## 2014-12-12 MED ORDER — METOCLOPRAMIDE HCL 5 MG/ML IJ SOLN
10.0000 mg | Freq: Once | INTRAMUSCULAR | Status: AC
  Administered 2014-12-12: 10 mg via INTRAVENOUS
  Filled 2014-12-12: qty 2

## 2014-12-12 MED ORDER — SODIUM CHLORIDE 0.9 % IV BOLUS (SEPSIS)
1000.0000 mL | Freq: Once | INTRAVENOUS | Status: AC
  Administered 2014-12-12: 1000 mL via INTRAVENOUS

## 2014-12-12 MED ORDER — METOCLOPRAMIDE HCL 10 MG PO TABS: 10.0000 mg | ORAL_TABLET | Freq: Four times a day (QID) | ORAL | Status: DC | PRN

## 2014-12-12 NOTE — ED Provider Notes (Addendum)
CSN: [id number]     Arrival date & time 12/12/14  0113 History   First MD Initiated Contact with Patient 12/12/14 0241     Chief Complaint  Patient presents with  . 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 She also has a history of heart murmur and MVP. Patient presents to the emergency department today for worsening vertigo. She states that she has noticed some worsening over the past few weeks intermittently, but symptoms acutely worsened this evening. She reports feeling off-balance with any movement. She has tried taking Meclizine-containing medication for symptoms, but this has provided her no relief. She has also tried some self maneuvers for vertigo, but this worsened her symptoms to the point of vomiting. She has some mild nausea at present. The patient also reports a mild left occipital and left temporal headache which is consistent with past exacerbations of her vertigo Patient denies any new head injury or trauma. She denies any hearing changes, hearing loss, tinnitus, vision changes, fever, neck stiffness, extremity numbness/tingling, extremity weakness. She is not currently followed by a neurologist, but was followed by a neurologist in the past.  The history is provided by the patient. No language interpreter was used.    Past Medical History  Diagnosis Date  . Mitral valve prolapse   . Kidney stone   . Vertigo   . Endometrial polyp 06/30/2013  . Heart murmur    Past Surgical History  Procedure Laterality Date  . Renal artery stent Left march 2014  . No past surgeries    . Dilatation & curettage/hysteroscopy with trueclear N/A 06/30/2013    Procedure: DILATATION & HYSTEROSCOPY/POLYPECTOMY WITH TRUCLEAR;  Surgeon: Robley Fries, MD;  Location: [facility];  Service: Gynecology;  Laterality:  N/A;   No family history on file. History  Substance Use Topics  . Smoking status: Never Smoker   . Smokeless tobacco: Not on file  . Alcohol Use: No   OB History    No data available      Review of Systems  Constitutional: Negative for fever.  HENT: Negative for hearing loss and tinnitus.   Eyes: Negative for visual disturbance.  Gastrointestinal: Positive for nausea and vomiting.  Neurological: Positive for dizziness and headaches. Negative for syncope, weakness and numbness.  All other systems reviewed and are negative.   Allergies  Hydrocodone; Oxycodone; and Keflex  Home Medications   Prior to Admission medications   Medication Sig Start Date End Date Taking? Authorizing Provider  acetaminophen (TYLENOL) 500 MG tablet Take 500 mg by mouth every 6 (six) hours as needed for mild pain.   Yes Historical Provider, MD  Meclizine HCl (BONINE PO) Take 1 tablet by mouth as needed (dizziness).   Yes Historical Provider, MD  omeprazole (PRILOSEC) 20 MG capsule Take 20 mg by mouth daily as needed (acid reflux).   Yes Historical Provider, MD  sertraline (ZOLOFT) 50 MG tablet Take 1 tablet by mouth daily. 12/08/14  Yes Historical Provider, MD  ibuprofen (ADVIL,MOTRIN) 600 MG tablet Take 1 tablet (600 mg total) by mouth every 6 (six) hours as needed. Patient not taking: Reported on 12/12/2014 06/30/13   Shea Evans, MD  metoCLOPramide (REGLAN) 10 MG tablet Take 1 tablet (10 mg total) by mouth every 6 (six) hours as needed for nausea or vomiting (with/without symptoms of vertigo). 12/12/14   Antony Madura, PA-C  BP 141/72 mmHg  Pulse 87  Temp(Src) 97.9 F (36.6 C) (Oral)  Resp 18  SpO2 98%  LMP 12/06/2014 (Approximate)   Physical Exam  Constitutional: She is oriented to person, place, and time. She appears well-developed and well-nourished. No distress.  Nontoxic/nonseptic appearing. Patient pleasant.  HENT:  Head: Normocephalic and atraumatic.  Mouth/Throat: Oropharynx is clear  and moist. No oropharyngeal exudate.  Uvula midline. Patient tolerating secretions without difficulty. Impacted cerumen noted in the left ear.  Eyes: Conjunctivae and EOM are normal. Pupils are equal, round, and reactive to light. No scleral icterus.  Normal EOMs. No nystagmus noted  Neck: Normal range of motion.  No nuchal rigidity or meningismus  Cardiovascular: Normal rate, regular rhythm and intact distal pulses.   Pulmonary/Chest: Effort normal and breath sounds normal. No respiratory distress. She has no wheezes. She has no rales.  Respirations even and unlabored  Musculoskeletal: Normal range of motion.  Neurological: She is alert and oriented to person, place, and time. No cranial nerve deficit. She exhibits normal muscle tone. Coordination normal.  GCS 15. Speech is goal oriented. No focal neurologic deficits appreciated. Patient moves extremities without ataxia. She ambulates with steady gait.  Skin: Skin is warm and dry. No rash noted. She is not diaphoretic. No erythema. No pallor.  Psychiatric: She has a normal mood and affect. Her behavior is normal.  Nursing note and vitals reviewed.   ED Course  EAR CERUMEN REMOVAL Date/Time: 12/20/2014 11:00 AM Performed by: Antony MaduraHUMES, Larenzo Caples Authorized by: Antony MaduraHUMES, Alazne Quant Consent: The procedure was performed in an emergent situation. Verbal consent obtained. Written consent not obtained. Risks and benefits: risks, benefits and alternatives were discussed Consent given by: patient Patient understanding: patient states understanding of the procedure being performed Patient consent: the patient's understanding of the procedure matches consent given Procedure consent: procedure consent matches procedure scheduled Relevant documents: relevant documents present and verified Test results: test results available and properly labeled Site marked: the operative site was marked Imaging studies: imaging studies available Required items: required blood  products, implants, devices, and special equipment available Patient identity confirmed: arm band and verbally with patient Local anesthetic: none Ceruminolytics applied: Ceruminolytics applied prior to the procedure. Location details: left ear Procedure type: irrigation Patient sedated: no Patient tolerance: Patient tolerated the procedure well with no immediate complications   (including critical care time) Labs Review Labs Reviewed - No data to display  Imaging Review No results found.   EKG Interpretation None      MDM   Final diagnoses:  BPPV (benign paroxysmal positional vertigo), unspecified laterality    39 year old female with a history of vertigo secondary to a closed head injury 20 years ago presents to the emergency department for further evaluation of worsening vertigo. Patient has tried home remedies as well as meclizine without relief of symptoms. Patient has a nonfocal neurologic exam. She is afebrile. No nuchal rigidity or meningismus. Given history of similar symptoms, including severe exacerbations, doubt emergent intracranial process.  Patient treated with Valium and Reglan. She is sleeping comfortably on reexamination. She is now moving her head more freely without complaints of severe vertigo. She has close f/u with her PCP who is planning to refer her back to her neurologist. No indication for further emergent work up at this time. Will d/c with instruction for PCP f/u. Return precautions discussed and provided. Patient agreeable to plan with no unaddressed concerns. Patient discharged in good condition; VSS.   Filed Vitals:   12/12/14 [id number] 12/12/14 [id number]  12/12/14 0500  BP: 137/88 137/83 141/72  Pulse: 95 82 87  Temp: 97.9 F (36.6 C) 97.9 F (36.6 C)   TempSrc: Oral Oral   Resp: SpO2: 99% 98% 98%    Antony Madura, PA-C 12/12/14 0524  April Palumbo, MD 12/12/14 0559  Antony Madura, PA-C 12/20/14 [address] PA-C 12/20/14  1102  April Palumbo, MD 12/29/14 [phone]

## 2014-12-12 NOTE — ED Notes (Signed)
Pt presents with c/o nausea. Pt reports she has a hx of vertigo and has been dizzy on and off for the past several weeks when she bends her head over but the past few hours her dizziness and nausea has increased and caused her to vomit.

## 2014-12-12 NOTE — Discharge Instructions (Signed)
Benign Positional Vertigo Vertigo means you feel like you or your surroundings are moving when they are not. Benign positional vertigo is the most common form of vertigo. Benign means that the cause of your condition is not serious. Benign positional vertigo is more common in older adults. CAUSES  Benign positional vertigo is the result of an upset in the labyrinth system. This is an area in the middle ear that helps control your balance. This may be caused by a viral infection, head injury, or repetitive motion. However, often no specific cause is found. SYMPTOMS  Symptoms of benign positional vertigo occur when you move your head or eyes in different directions. Some of the symptoms may include:  Loss of balance and falls.  Vomiting.  Blurred vision.  Dizziness.  Nausea.  Involuntary eye movements (nystagmus). DIAGNOSIS  Benign positional vertigo is usually diagnosed by physical exam. If the specific cause of your benign positional vertigo is unknown, your caregiver may perform imaging tests, such as magnetic resonance imaging (MRI) or computed tomography (CT). TREATMENT  Your caregiver may recommend movements or procedures to correct the benign positional vertigo. Medicines such as meclizine, benzodiazepines, and medicines for nausea may be used to treat your symptoms. In rare cases, if your symptoms are caused by certain conditions that affect the inner ear, you may need surgery. HOME CARE INSTRUCTIONS   Follow your caregiver's instructions.  Move slowly. Do not make sudden body or head movements.  Avoid driving.  Avoid operating heavy machinery.  Avoid performing any tasks that would be dangerous to you or others during a vertigo episode.  Drink enough fluids to keep your urine clear or pale yellow. SEEK IMMEDIATE MEDICAL CARE IF:   You develop problems with walking, weakness, numbness, or using your arms, hands, or legs.  You have difficulty speaking.  You develop  severe headaches.  Your nausea or vomiting continues or gets worse.  You develop visual changes.  Your family or friends notice any behavioral changes.  Your condition gets worse.  You have a fever.  You develop a stiff neck or sensitivity to light. MAKE SURE YOU:   Understand these instructions.  Will watch your condition.  Will get help right away if you are not doing well or get worse. Document Released: 02/16/2006 Document Revised: 08/03/2011 Document Reviewed: 01/29/2011 ExitCare Patient Information 2015 [employer]. This information is not intended to replace advice given to you by your health care provider. Make sure you discuss any questions you have with your health care provider.    

## 2014-12-14 LAB — OB RESULTS CONSOLE RUBELLA ANTIBODY, IGM: RUBELLA: IMMUNE

## 2014-12-18 ENCOUNTER — Encounter: Payer: Self-pay | Admitting: Neurology

## 2014-12-18 ENCOUNTER — Ambulatory Visit (INDEPENDENT_AMBULATORY_CARE_PROVIDER_SITE_OTHER): Payer: BC Managed Care – PPO | Admitting: Neurology

## 2014-12-18 VITALS — BP 108/68 | HR 80 | Resp 16 | Ht 69.0 in | Wt 145.0 lb

## 2014-12-18 DIAGNOSIS — H8110 Benign paroxysmal vertigo, unspecified ear: Secondary | ICD-10-CM

## 2014-12-18 NOTE — Patient Instructions (Signed)
Please remember, that vertigo can recur without warning. It can last hours or days. Please change positions slowly and always stay well-hydrated. Physical therapy with particular attention to vestibular rehabilitation can be very helpful. While there is no specific medication that helps with vertigo, some people get relief with as needed use of meclizine. Certain medications can exacerbate vertigo.   Keep a good sleep schedule, avoid stress.   You have exercises for vertigo. You can use meclizine as needed. You can use Reglan as needed.   I will see you as needed. No further test needed, exam looks good.
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
## 2015-05-26 NOTE — L&D Delivery Note (Signed)
Delivery Note At 10:14 AM a viable and healthy female was delivered via Vaginal, Spontaneous Delivery (Presentation: LOA;LOT).  APGAR: 8, 9; weight 6 lb 6.1 oz (2895 g).   Placenta status: retained.  Cord: 3VC with the following complications: ankle cord  Anesthesia: epidural   Episiotomy: None Lacerations: 2nd degree;Perineal Suture Repair: 2.0 vicryl Est. Blood Loss (mL):  300+  Mom to postpartum.  Baby to Couplet care / Skin to Skin.  Bovard-Stuckert, Anikah Hogge 12/24/2015, 10:51 AM  Pt consented for suction D&E, poss hysterectomy.  D/W pt abnormal placentation.  D/W pt r/b/a.  Will proceed  See operative note for more info  Br/O+/RI//Tdap 10/24/15/Contra ?

## 2015-06-14 DIAGNOSIS — K649 Unspecified hemorrhoids: Secondary | ICD-10-CM | POA: Insufficient documentation

## 2015-06-14 DIAGNOSIS — F419 Anxiety disorder, unspecified: Secondary | ICD-10-CM | POA: Insufficient documentation

## 2015-06-21 DIAGNOSIS — Z3490 Encounter for supervision of normal pregnancy, unspecified, unspecified trimester: Secondary | ICD-10-CM | POA: Insufficient documentation

## 2015-06-21 LAB — OB RESULTS CONSOLE ABO/RH: RH Type: POSITIVE

## 2015-06-21 LAB — OB RESULTS CONSOLE GC/CHLAMYDIA
CHLAMYDIA, DNA PROBE: NEGATIVE
Gonorrhea: NEGATIVE

## 2015-06-21 LAB — OB RESULTS CONSOLE ANTIBODY SCREEN: Antibody Screen: NEGATIVE

## 2015-12-04 LAB — OB RESULTS CONSOLE HIV ANTIBODY (ROUTINE TESTING): HIV: NONREACTIVE

## 2015-12-04 LAB — OB RESULTS CONSOLE RPR: RPR: NONREACTIVE

## 2015-12-10 LAB — OB RESULTS CONSOLE GBS: STREP GROUP B AG: NEGATIVE

## 2015-12-11 LAB — OB RESULTS CONSOLE HEPATITIS B SURFACE ANTIGEN: HEP B S AG: NEGATIVE

## 2015-12-17 ENCOUNTER — Encounter (HOSPITAL_COMMUNITY): Payer: Self-pay | Admitting: *Deleted

## 2015-12-17 ENCOUNTER — Telehealth (HOSPITAL_COMMUNITY): Payer: Self-pay | Admitting: *Deleted

## 2015-12-17 NOTE — Telephone Encounter (Signed)
Preadmission screen  

## 2015-12-20 NOTE — H&P (Signed)
Martha Mason is a 40 y.o. female presenting for scheduled iol for preeclampsia. Pt conceived via IVF  (failed iui x 5). Dating is based on insertion date.  Neg preimplantation genetics and panorama screen. She has a hx of mitral valve prolapse and tachycardia with anxiety. Her prenatal care was benign until 26-[redacted]weeks gestation when begun to have some elevated BPs. As this progressed preE labs were done and pt was also noted to have >3000 of protein in urine. She was started on labetalol for bp control last week. Headaches and blurry vision have improved in past few weeks. Plan for iol at 37& weeks per guidelines.   OB History    Gravida Para Term Preterm AB Living   1             SAB TAB Ectopic Multiple Live Births                 Past Medical History:  Diagnosis Date  . Anxiety   . Endometrial polyp 06/30/2013  . Head injuries    Closed head injury   . Heart murmur   . Kidney stone   . Kidney stones   . Meningitis    At age 55  . Mitral valve prolapse   . MVP (mitral valve prolapse)   . Preeclampsia   . Vertigo    Past Surgical History:  Procedure Laterality Date  . DILATATION & CURETTAGE/HYSTEROSCOPY WITH TRUECLEAR N/A 06/30/2013   Procedure: DILATATION & HYSTEROSCOPY/POLYPECTOMY WITH TRUCLEAR;  Surgeon: Robley Fries, MD;  Location: [facility];  Service: Gynecology;  Laterality: N/A;  . NO PAST SURGERIES    . RENAL ARTERY STENT Left march 2014  . uterine polyp     Family History: family history includes Cancer in her paternal grandfather; Heart attack in her father; Prostate cancer in her father; Stroke in her paternal grandmother. Social History:  reports that she has never smoked. She does not have any smokeless tobacco history on file. She reports that she does not drink alcohol or use drugs.     Maternal Diabetes: No Genetic Screening: Normal Maternal Ultrasounds/Referrals: Normal Fetal Ultrasounds or other Referrals:  None Maternal Substance Abuse:  No Significant  Maternal Medications:  None Significant Maternal Lab Results:  Lab values include: Group B Strep negative Other Comments:  None  Review of Systems  Constitutional: Negative for fever and malaise/fatigue.  Eyes: Negative for blurred vision.  Respiratory: Negative for shortness of breath.   Cardiovascular: Negative for chest pain and leg swelling.  Skin: Negative for itching and rash.  Neurological: Negative for dizziness, tremors and headaches.  Endo/Heme/Allergies: Does not bruise/bleed easily.  Psychiatric/Behavioral: Negative for depression, hallucinations, substance abuse and suicidal ideas. The patient is nervous/anxious.    Maternal Medical History:  Reason for admission: iol for preeclampsia  Contractions: Frequency: irregular.   Perceived severity is mild.    Fetal activity: Perceived fetal activity is normal.    Prenatal complications: Pre-eclampsia.   Prenatal Complications - Diabetes: none.      There were no vitals taken for this visit. Maternal Exam:  Uterine Assessment: Contraction strength is mild.  Contraction frequency is irregular.   Abdomen: Patient reports generalized tenderness.  Estimated fetal weight is AGA.   Fetal presentation: vertex  Introitus: Normal vulva. Vulva is negative for condylomata.  Normal vagina.  Vagina is negative for condylomata and discharge.  Pelvis: adequate for delivery.   Cervix: Cervix evaluated by digital exam.     Physical Exam  Constitutional:  She appears well-developed and well-nourished.  HENT:  Head: Normocephalic.  Eyes: Pupils are equal, round, and reactive to light.  Neck: Normal range of motion.  Cardiovascular: Normal rate.   Respiratory: Effort normal.  GI: Soft. There is generalized tenderness.  Genitourinary: Vagina normal and uterus normal. No vaginal discharge found.  Musculoskeletal: Normal range of motion.  Neurological: She is alert.  Skin: Skin is warm and dry.  Psychiatric: She has a normal mood  and affect. Her behavior is normal. Judgment and thought content normal.    Prenatal labs: ABO, Rh: O/Positive/-- (01/27 0000) Antibody: Negative (01/27 0000) Rubella:   RPR: Nonreactive (07/12 0000)  HBsAg:    HIV: Non-reactive (07/12 0000)  GBS: Negative (07/18 0000)   Assessment/Plan: G1P0 presenting at 90 2/[redacted]wks gestational age foriol due to preeclampsia Cytotec for cervical ripening Monitor bp and uop in labor ; manage as indicated Preeclampsia labs on admission Pain control prn Anticipate svd   [name] [name] 12/20/2015, 3:28 PM

## 2015-12-23 ENCOUNTER — Inpatient Hospital Stay (HOSPITAL_COMMUNITY): Payer: BC Managed Care – PPO | Admitting: Anesthesiology

## 2015-12-23 ENCOUNTER — Inpatient Hospital Stay (HOSPITAL_COMMUNITY)
Admission: RE | Admit: 2015-12-23 | Discharge: 2015-12-26 | DRG: 767 | Disposition: A | Discharge status: Home / Self Care | Payer: BC Managed Care – PPO | Source: Ambulatory Visit | Attending: Obstetrics and Gynecology | Admitting: Obstetrics and Gynecology

## 2015-12-23 ENCOUNTER — Encounter (HOSPITAL_COMMUNITY): Payer: Self-pay

## 2015-12-23 ENCOUNTER — Inpatient Hospital Stay (HOSPITAL_COMMUNITY): Admission: RE | Admit: 2015-12-23 | Payer: BC Managed Care – PPO | Source: Ambulatory Visit

## 2015-12-23 DIAGNOSIS — O1404 Mild to moderate pre-eclampsia, complicating childbirth: Principal | ICD-10-CM | POA: Diagnosis present

## 2015-12-23 DIAGNOSIS — Z8042 Family history of malignant neoplasm of prostate: Secondary | ICD-10-CM | POA: Diagnosis not present

## 2015-12-23 DIAGNOSIS — Z3A37 37 weeks gestation of pregnancy: Secondary | ICD-10-CM | POA: Diagnosis not present

## 2015-12-23 DIAGNOSIS — Z823 Family history of stroke: Secondary | ICD-10-CM | POA: Diagnosis not present

## 2015-12-23 DIAGNOSIS — I341 Nonrheumatic mitral (valve) prolapse: Secondary | ICD-10-CM | POA: Diagnosis present

## 2015-12-23 DIAGNOSIS — O9942 Diseases of the circulatory system complicating childbirth: Secondary | ICD-10-CM | POA: Diagnosis present

## 2015-12-23 DIAGNOSIS — O1490 Unspecified pre-eclampsia, unspecified trimester: Secondary | ICD-10-CM | POA: Diagnosis present

## 2015-12-23 LAB — CBC
HCT: 38.7 % (ref 36.0–46.0)
HCT: 38.9 % (ref 36.0–46.0)
HEMOGLOBIN: 13.7 g/dL (ref 12.0–15.0)
Hemoglobin: 13.7 g/dL (ref 12.0–15.0)
MCH: 33.7 pg (ref 26.0–34.0)
MCH: 33.8 pg (ref 26.0–34.0)
MCHC: 35.4 g/dL (ref 30.0–36.0)
MCHC: 35.2 g/dL (ref 30.0–36.0)
MCV: 95.1 fL (ref 78.0–100.0)
MCV: 96 fL (ref 78.0–100.0)
PLATELETS: 172 10*3/uL (ref 150–400)
Platelets: 196 10*3/uL (ref 150–400)
RBC: 4.05 MIL/uL (ref 3.87–5.11)
RBC: 4.07 MIL/uL (ref 3.87–5.11)
RDW: 13.2 % (ref 11.5–15.5)
RDW: 13 % (ref 11.5–15.5)
WBC: 11.2 10*3/uL — AB (ref 4.0–10.5)
WBC: 9 10*3/uL (ref 4.0–10.5)

## 2015-12-23 LAB — COMPREHENSIVE METABOLIC PANEL
ALK PHOS: 171 U/L — AB (ref 38–126)
ALT: 35 U/L (ref 14–54)
ANION GAP: 7 (ref 5–15)
AST: 43 U/L — ABNORMAL HIGH (ref 15–41)
Albumin: 2.1 g/dL — ABNORMAL LOW (ref 3.5–5.0)
BUN: 21 mg/dL — ABNORMAL HIGH (ref 6–20)
CALCIUM: 8.6 mg/dL — AB (ref 8.9–10.3)
CHLORIDE: 107 mmol/L (ref 101–111)
CO2: 22 mmol/L (ref 22–32)
Creatinine, Ser: 1.03 mg/dL — ABNORMAL HIGH (ref 0.44–1.00)
GFR calc non Af Amer: >60 mL/min (ref >60)
Glucose, Bld: 98 mg/dL (ref 65–99)
Potassium: 4.3 mmol/L (ref 3.5–5.1)
SODIUM: 136 mmol/L (ref 135–145)
Total Bilirubin: 0.4 mg/dL (ref 0.3–1.2)
Total Protein: 4.6 g/dL — ABNORMAL LOW (ref 6.5–8.1)

## 2015-12-23 LAB — TYPE AND SCREEN
ABO/RH(D): O POS
ANTIBODY SCREEN: NEGATIVE

## 2015-12-23 LAB — ABO/RH: ABO/RH(D): O POS

## 2015-12-23 LAB — LACTATE DEHYDROGENASE: LDH: 224 U/L — ABNORMAL HIGH (ref 98–192)

## 2015-12-23 LAB — RPR: RPR: NONREACTIVE

## 2015-12-23 LAB — URIC ACID: URIC ACID, SERUM: 7.5 mg/dL — AB (ref 2.3–6.6)

## 2015-12-23 MED ORDER — MISOPROSTOL 25 MCG QUARTER TABLET
25.0000 ug | ORAL_TABLET | ORAL | Status: DC | PRN
  Administered 2015-12-23 (×3): 25 ug via VAGINAL
  Filled 2015-12-23 (×3): qty 0.25

## 2015-12-23 MED ORDER — DIPHENHYDRAMINE HCL 50 MG/ML IJ SOLN: 12.5000 mg | INTRAMUSCULAR | Status: DC | PRN

## 2015-12-23 MED ORDER — OXYTOCIN BOLUS FROM INFUSION
500.0000 mL | Freq: Once | INTRAVENOUS | Status: AC
  Administered 2015-12-24: 500 mL via INTRAVENOUS

## 2015-12-23 MED ORDER — LABETALOL HCL 5 MG/ML IV SOLN
20.0000 mg | INTRAVENOUS | Status: DC | PRN
  Administered 2015-12-23 (×2): 20 mg via INTRAVENOUS
  Filled 2015-12-23: qty 4

## 2015-12-23 MED ORDER — SERTRALINE HCL 50 MG PO TABS
50.0000 mg | ORAL_TABLET | Freq: Every day | ORAL | Status: DC
  Administered 2015-12-23 – 2015-12-26 (×3): 50 mg via ORAL
  Filled 2015-12-23 (×4): qty 1

## 2015-12-23 MED ORDER — OXYTOCIN 40 UNITS IN LACTATED RINGERS INFUSION - SIMPLE MED
1.0000 m[IU]/min | INTRAVENOUS | Status: DC
  Administered 2015-12-23: 2 m[IU]/min via INTRAVENOUS

## 2015-12-23 MED ORDER — LIDOCAINE HCL (PF) 1 % IJ SOLN
30.0000 mL | INTRAMUSCULAR | Status: DC | PRN
  Filled 2015-12-23: qty 30

## 2015-12-23 MED ORDER — FENTANYL 2.5 MCG/ML BUPIVACAINE 1/10 % EPIDURAL INFUSION (WH - ANES)
14.0000 mL/h | INTRAMUSCULAR | Status: DC | PRN
  Administered 2015-12-23 – 2015-12-24 (×2): 14 mL/h via EPIDURAL
  Filled 2015-12-23 (×2): qty 125

## 2015-12-23 MED ORDER — OXYCODONE-ACETAMINOPHEN 5-325 MG PO TABS: 1.0000 | ORAL_TABLET | ORAL | Status: DC | PRN

## 2015-12-23 MED ORDER — HYDRALAZINE HCL 20 MG/ML IJ SOLN: 10.0000 mg | Freq: Once | INTRAMUSCULAR | Status: DC | PRN

## 2015-12-23 MED ORDER — LABETALOL HCL 100 MG PO TABS
100.0000 mg | ORAL_TABLET | Freq: Two times a day (BID) | ORAL | Status: DC
  Administered 2015-12-23 – 2015-12-26 (×6): 100 mg via ORAL
  Filled 2015-12-23 (×9): qty 1

## 2015-12-23 MED ORDER — LACTATED RINGERS IV SOLN
500.0000 mL | INTRAVENOUS | Status: DC | PRN
  Administered 2015-12-23: 1000 mL via INTRAVENOUS

## 2015-12-23 MED ORDER — EPHEDRINE 5 MG/ML INJ
10.0000 mg | INTRAVENOUS | Status: DC | PRN
Start: 2015-12-23 — End: 2015-12-24

## 2015-12-23 MED ORDER — TERBUTALINE SULFATE 1 MG/ML IJ SOLN: 0.2500 mg | Freq: Once | INTRAMUSCULAR | Status: DC | PRN

## 2015-12-23 MED ORDER — LIDOCAINE HCL (PF) 1 % IJ SOLN
INTRAMUSCULAR | Status: DC | PRN
  Administered 2015-12-23 (×2): 6 mL

## 2015-12-23 MED ORDER — PHENYLEPHRINE 40 MCG/ML (10ML) SYRINGE FOR IV PUSH (FOR BLOOD PRESSURE SUPPORT): 80.0000 ug | PREFILLED_SYRINGE | INTRAVENOUS | Status: DC | PRN

## 2015-12-23 MED ORDER — ACETAMINOPHEN 325 MG PO TABS: 650.0000 mg | ORAL_TABLET | ORAL | Status: DC | PRN

## 2015-12-23 MED ORDER — TERBUTALINE SULFATE 1 MG/ML IJ SOLN
0.2500 mg | Freq: Once | INTRAMUSCULAR | Status: DC | PRN
  Filled 2015-12-23: qty 1

## 2015-12-23 MED ORDER — LACTATED RINGERS IV SOLN
INTRAVENOUS | Status: DC
  Administered 2015-12-23 – 2015-12-24 (×4): via INTRAVENOUS

## 2015-12-23 MED ORDER — LACTATED RINGERS IV SOLN: 500.0000 mL | Freq: Once | INTRAVENOUS | Status: DC

## 2015-12-23 MED ORDER — SOD CITRATE-CITRIC ACID 500-334 MG/5ML PO SOLN
30.0000 mL | ORAL | Status: DC | PRN
  Filled 2015-12-23 (×2): qty 15

## 2015-12-23 MED ORDER — OXYCODONE-ACETAMINOPHEN 5-325 MG PO TABS: 2.0000 | ORAL_TABLET | ORAL | Status: DC | PRN

## 2015-12-23 MED ORDER — LACTATED RINGERS IV SOLN
500.0000 mL | Freq: Once | INTRAVENOUS | Status: AC
  Administered 2015-12-23: 500 mL via INTRAVENOUS

## 2015-12-23 MED ORDER — PHENYLEPHRINE 40 MCG/ML (10ML) SYRINGE FOR IV PUSH (FOR BLOOD PRESSURE SUPPORT)
80.0000 ug | PREFILLED_SYRINGE | INTRAVENOUS | Status: DC | PRN
Start: 2015-12-23 — End: 2015-12-24
  Filled 2015-12-23: qty 10

## 2015-12-23 MED ORDER — LABETALOL HCL 5 MG/ML IV SOLN
INTRAVENOUS | Status: AC
  Filled 2015-12-23: qty 4

## 2015-12-23 MED ORDER — OXYTOCIN 40 UNITS IN LACTATED RINGERS INFUSION - SIMPLE MED
2.5000 [IU]/h | INTRAVENOUS | Status: DC
  Administered 2015-12-24: 2.5 [IU]/h via INTRAVENOUS
  Administered 2015-12-24: 800 mL via INTRAVENOUS
  Administered 2015-12-24: 100 mL via INTRAVENOUS
  Filled 2015-12-23 (×2): qty 1000

## 2015-12-23 MED ORDER — ONDANSETRON HCL 4 MG/2ML IJ SOLN: 4.0000 mg | Freq: Four times a day (QID) | INTRAMUSCULAR | Status: DC | PRN

## 2015-12-23 NOTE — Anesthesia Procedure Notes (Signed)
Epidural Patient location during procedure: OB  Staffing Anesthesiologist: Franne Grip  Preanesthetic Checklist Completed: patient identified, site marked, surgical consent, pre-op evaluation, timeout performed, IV checked, risks and benefits discussed and monitors and equipment checked  Epidural Patient position: sitting Prep: DuraPrep Patient monitoring: blood pressure and heart rate Approach: midline Location: L4-L5 Injection technique: LOR saline  Needle:  Needle type: Tuohy  Needle gauge: 17 G Needle length: 9 cm Needle insertion depth: 5 cm Catheter type: closed end flexible Catheter size: 19 Gauge Catheter at skin depth: 13 cm Test dose: negative and Other  Assessment Events: blood not aspirated, injection not painful, no injection resistance, negative IV test and no paresthesia  Additional Notes Reason for block:procedure for pain

## 2015-12-23 NOTE — Progress Notes (Signed)
Patient ID: Martha Mason, female   DOB: 1975/07/12, 40 y.o.   MRN: [id number] Pt reporting increasing pain with contractions. Upset and worried about needing epidural. Doula and family present at bedside.  BP 160-177/93-99 EFM - 150, moderate variability, +accels, variable decels, at 2 TOCO - contractions q SVE - 3.5/80/-3  A/P: Progressing on pitocin; now at         Pt to get labetalol pm dose now; will give iv meds if needed; MgSO4 - pt counselled          Had talk with pt to encourage her to not consider pain meds as "failure" - if unable to tolerate pain can consider those options if breathing and position changes not helping         Will recheck in next 2-4hours

## 2015-12-23 NOTE — Progress Notes (Signed)
Martha Mason is a 41 y.o. G1P0 at [redacted]w[redacted]d by ultrasound admitted for induction of labor due to Pre-eclamptic toxemia of pregnancy..  Subjective:   Objective: BP (!) 143/76   Pulse 74   Temp 97.6 F (36.4 C) (Oral)   Resp 20   Ht 5\' 9"  (1.753 m)   Wt 159 lb (72.1 kg)   BMI 23.48 kg/m  No intake/output data recorded. No intake/output data recorded.  FHT:  FHR: 150 bpm, variability: moderate,  accelerations:  Present,  decelerations:  Absent UC:   irregular, every 3 minutes SVE:   Dilation: 3 Effacement (%): 90 Station: -3 Exam by:: Dr. Mindi Slicker  Labs: Lab Results  Component Value Date   WBC 9.0 12/23/2015   HGB 13.7 12/23/2015   HCT 38.7 12/23/2015   MCV 95.1 12/23/2015   PLT 196 12/23/2015    Assessment / Plan: Induction of labor due to preeclampsia,  progressing well on pitocin  Labor: SROM with copious clear fluid. Foley catheter removed. Cervix now 3cm dilated; will augment with pitocin per protocol Preeclampsia:  no signs or symptoms of toxicity; bp stable on labetalol 100mg  po bid Fetal Wellbeing:  Category I Pain Control:  Labor support without medications I/D:  n/a Anticipated MOD:  NSVD  Martha Mason 12/23/2015, 5:50 PM

## 2015-12-23 NOTE — Anesthesia Pain Management Evaluation Note (Signed)
  CRNA Pain Management Visit Note  Patient: Martha Mason, 40 y.o., female  "Hello I am a member of the anesthesia team at [facility]. We have an anesthesia team available at all times to provide care throughout the hospital, including epidural management and anesthesia for C-section. I don't know your plan for the delivery whether it a natural birth, water birth, IV sedation, nitrous supplementation, doula or epidural, but we want to meet your pain goals."   1.Was your pain managed to your expectations on prior hospitalizations?     2.What is your expectation for pain management during this hospitalization?       3.How can we help you reach that goal?   Record the patient's initial score and the patient's pain goal.   Pain: 0  Pain Goal: 5 [facility] wants you to be able to say your pain was always managed very well.  Laban Emperor 12/23/2015

## 2015-12-23 NOTE — Anesthesia Preprocedure Evaluation (Addendum)
Anesthesia Evaluation  Patient identified by MRN, date of birth, ID band Patient awake    Reviewed: Allergy & Precautions, NPO status , Patient's Chart, lab work & pertinent test results  Airway Mallampati: II  TM Distance: >3 FB Neck ROM: Full    Dental no notable dental hx.    Pulmonary neg pulmonary ROS,    Pulmonary exam normal breath sounds clear to auscultation       Cardiovascular negative cardio ROS Normal cardiovascular exam+ Valvular Problems/Murmurs MVP  Rhythm:Regular Rate:Normal  MVP   Neuro/Psych negative neurological ROS  negative psych ROS   GI/Hepatic negative GI ROS, Neg liver ROS,   Endo/Other  negative endocrine ROS  Renal/GU Renal disease  negative genitourinary   Musculoskeletal negative musculoskeletal ROS (+)   Abdominal   Peds negative pediatric ROS (+)  Hematology negative hematology ROS (+)   Anesthesia Other Findings   Reproductive/Obstetrics negative OB ROS                             Anesthesia Physical Anesthesia Plan  ASA: III  Anesthesia Plan: Epidural   Post-op Pain Management:    Induction: Intravenous  Airway Management Planned: Natural Airway  Additional Equipment:   Intra-op Plan:   Post-operative Plan: Extubation in OR  Informed Consent: I have reviewed the patients History and Physical, chart, labs and discussed the procedure including the risks, benefits and alternatives for the proposed anesthesia with the patient or authorized representative who has indicated his/her understanding and acceptance.   Dental advisory given  Plan Discussed with: CRNA  Anesthesia Plan Comments: (Informed consent obtained prior to proceeding including risk of failure, 1% risk of PDPH, risk of minor discomfort and bruising.  Discussed rare but serious complications including epidural abscess, permanent nerve injury, epidural hematoma.  Discussed  alternatives to epidural analgesia and patient desires to proceed.  Timeout performed pre-procedure verifying patient name, procedure, and platelet count.  Patient tolerated procedure well.  Preeclampsia. Platelet count 172 K at 2110. Patient receiving labetalol for BP.   She states that tape just leaves a red mark. She understands tape will hold epidural in place and she believes the tape will not be a problem other than a red mark.)      Anesthesia Quick Evaluation

## 2015-12-23 NOTE — Progress Notes (Signed)
Patient ID: Martha Mason, female   DOB: 1976-03-25, 40 y.o.   MRN: [id number] Pt admitted overnight and received one cytotec at 115am.   Having mild contractions that she does not feel very much.    FHR Category 1  Cervix 0.5/50/-3  Labs WNL except creatinine 1.03 and AST 43 Plts 196  BP on arrival 160/90 but improved s/p one dose of IV labetalol and has been 140/70-80 for majority of night. Second dose of cytotec placed.

## 2015-12-23 NOTE — Progress Notes (Signed)
Patient ID: Martha Mason, female   DOB: 20-Apr-1976, 40 y.o.   MRN: [id number] Pt comfortable with epidural. No complaints- no headaches, blurry vision or discomfort.  BP 145-157/72-91 EFM- 145, moderate variability, variable decels, +accels, cat 2 TOCO - contrractions q 1-53mins SVE 4/90/-2, mid position ( was posterior), soft  A/P: Progressing well in labor on pitocin;  iol for preE         BP controlled at this time on labetalol; continue to monitor; asymptomatic         Recheck in 3-4hours or prn         Anticipate svd

## 2015-12-23 NOTE — Progress Notes (Signed)
Patient ID: Martha Mason, female   DOB: [birth year], 40 y.o.   MRN: [id number] Late entry Pt seen around 2pm. Cervix uncahnged at 1cm and high Cooks double foley catheter placed with no difficulty 60u/40v Cat 1 strip  Contractions q  Continue iol

## 2015-12-23 NOTE — Progress Notes (Signed)
Martha Mason is a 40 y.o. G1P0 at [redacted]w[redacted]d by date of implantation admitted for induction of labor due to Pre-eclamptic toxemia of pregnancy..  Subjective:   Objective: BP (!) 158/77   Pulse 67   Temp 98.5 F (36.9 C) (Oral)   Resp 16   Ht 5\' 9"  (1.753 m)   Wt 159 lb (72.1 kg)   BMI 23.48 kg/m  No intake/output data recorded. No intake/output data recorded.  FHT:  FHR: 150 bpm, variability: moderate,  accelerations:  Present,  decelerations:  Absent UC:   irregular, every 3 minutes SVE:   Dilation: Fingertip Effacement (%): 50 Station: -3 Exam by:: Dr. Senaida Ores   Labs: Lab Results  Component Value Date   WBC 9.0 12/23/2015   HGB 13.7 12/23/2015   HCT 38.7 12/23/2015   MCV 95.1 12/23/2015   PLT 196 12/23/2015    Assessment / Plan: Induction of labor due to preeclampsia,  progressing well on pitocin  Labor: on second dose of cytotec Preeclampsia:  no signs or symptoms of toxicity, intake and ouput balanced, labs stable and bp stable after a dose of labetalol Fetal Wellbeing:  Category I Pain Control:  Labor support without medications I/D:  n/a Anticipated MOD:  NSVD  [name] Banga 12/23/2015, 8:35 AM

## 2015-12-24 ENCOUNTER — Encounter (HOSPITAL_COMMUNITY): Payer: Self-pay

## 2015-12-24 ENCOUNTER — Inpatient Hospital Stay (HOSPITAL_COMMUNITY): Payer: BC Managed Care – PPO | Admitting: Anesthesiology

## 2015-12-24 ENCOUNTER — Inpatient Hospital Stay (HOSPITAL_COMMUNITY): Payer: BC Managed Care – PPO

## 2015-12-24 ENCOUNTER — Encounter (HOSPITAL_COMMUNITY)
Admission: RE | Disposition: A | Discharge status: Home / Self Care | Payer: Self-pay | Source: Ambulatory Visit | Attending: Obstetrics and Gynecology

## 2015-12-24 HISTORY — PX: DILATION AND CURETTAGE OF UTERUS: SHX78

## 2015-12-24 LAB — CBC
HEMATOCRIT: 39.8 % (ref 36.0–46.0)
HEMATOCRIT: 32.3 % — AB (ref 36.0–46.0)
HEMOGLOBIN: 13.9 g/dL (ref 12.0–15.0)
Hemoglobin: 11.3 g/dL — ABNORMAL LOW (ref 12.0–15.0)
MCH: 33.8 pg (ref 26.0–34.0)
MCH: 33.6 pg (ref 26.0–34.0)
MCHC: 34.9 g/dL (ref 30.0–36.0)
MCHC: 35 g/dL (ref 30.0–36.0)
MCV: 96.8 fL (ref 78.0–100.0)
MCV: 96.1 fL (ref 78.0–100.0)
Platelets: 186 10*3/uL (ref 150–400)
Platelets: 171 10*3/uL (ref 150–400)
RBC: 4.11 MIL/uL (ref 3.87–5.11)
RBC: 3.36 MIL/uL — ABNORMAL LOW (ref 3.87–5.11)
RDW: 13.4 % (ref 11.5–15.5)
RDW: 13.3 % (ref 11.5–15.5)
WBC: 20.5 10*3/uL — ABNORMAL HIGH (ref 4.0–10.5)
WBC: 26.9 10*3/uL — AB (ref 4.0–10.5)

## 2015-12-24 SURGERY — DILATION AND CURETTAGE
Anesthesia: Epidural | Site: Vagina | Wound class: Clean Contaminated

## 2015-12-24 SURGERY — DILATION AND CURETTAGE
Anesthesia: Choice

## 2015-12-24 MED ORDER — PRENATAL MULTIVITAMIN CH
1.0000 | ORAL_TABLET | Freq: Every day | ORAL | Status: DC
  Administered 2015-12-25 – 2015-12-26 (×2): 1 via ORAL
  Filled 2015-12-24 (×2): qty 1

## 2015-12-24 MED ORDER — COCONUT OIL OIL
1.0000 "application " | TOPICAL_OIL | Status: DC | PRN
  Administered 2015-12-25: 1 via TOPICAL
  Filled 2015-12-24: qty 120

## 2015-12-24 MED ORDER — ONDANSETRON HCL 4 MG/2ML IJ SOLN: 4.0000 mg | Freq: Three times a day (TID) | INTRAMUSCULAR | Status: DC | PRN

## 2015-12-24 MED ORDER — DIPHENHYDRAMINE HCL 25 MG PO CAPS: 25.0000 mg | ORAL_CAPSULE | ORAL | Status: DC | PRN

## 2015-12-24 MED ORDER — PHENYLEPHRINE 40 MCG/ML (10ML) SYRINGE FOR IV PUSH (FOR BLOOD PRESSURE SUPPORT)
PREFILLED_SYRINGE | INTRAVENOUS | Status: AC
  Filled 2015-12-24: qty 10

## 2015-12-24 MED ORDER — LACTATED RINGERS IV SOLN
INTRAVENOUS | Status: DC
  Administered 2015-12-24: 19:00:00 via INTRAVENOUS

## 2015-12-24 MED ORDER — ONDANSETRON HCL 4 MG/2ML IJ SOLN
INTRAMUSCULAR | Status: DC | PRN
Start: 2015-12-24 — End: 2015-12-24
  Administered 2015-12-24: 4 mg via INTRAVENOUS

## 2015-12-24 MED ORDER — SODIUM CHLORIDE 0.9 % IR SOLN
Status: DC | PRN
  Administered 2015-12-24: 1000 mL

## 2015-12-24 MED ORDER — NALOXONE HCL 0.4 MG/ML IJ SOLN: 0.4000 mg | INTRAMUSCULAR | Status: DC | PRN

## 2015-12-24 MED ORDER — ONDANSETRON HCL 4 MG/2ML IJ SOLN: 4.0000 mg | INTRAMUSCULAR | Status: DC | PRN

## 2015-12-24 MED ORDER — NALBUPHINE HCL 10 MG/ML IJ SOLN: 5.0000 mg | INTRAMUSCULAR | Status: DC | PRN

## 2015-12-24 MED ORDER — LACTATED RINGERS IV SOLN
INTRAVENOUS | Status: DC | PRN
  Administered 2015-12-24: 11:00:00 via INTRAVENOUS

## 2015-12-24 MED ORDER — ACETAMINOPHEN-CODEINE #3 300-30 MG PO TABS: 2.0000 | ORAL_TABLET | ORAL | Status: DC | PRN

## 2015-12-24 MED ORDER — MIDAZOLAM HCL 2 MG/2ML IJ SOLN
INTRAMUSCULAR | Status: AC
  Filled 2015-12-24: qty 2

## 2015-12-24 MED ORDER — ACETAMINOPHEN-CODEINE #3 300-30 MG PO TABS: 1.0000 | ORAL_TABLET | ORAL | Status: DC | PRN

## 2015-12-24 MED ORDER — MEPERIDINE HCL 25 MG/ML IJ SOLN: 6.2500 mg | INTRAMUSCULAR | Status: DC | PRN

## 2015-12-24 MED ORDER — ZOLPIDEM TARTRATE 5 MG PO TABS: 5.0000 mg | ORAL_TABLET | Freq: Every evening | ORAL | Status: DC | PRN

## 2015-12-24 MED ORDER — NALOXONE HCL 2 MG/2ML IJ SOSY
1.0000 ug/kg/h | PREFILLED_SYRINGE | INTRAVENOUS | Status: DC | PRN
  Filled 2015-12-24: qty 2

## 2015-12-24 MED ORDER — OXYCODONE HCL 5 MG PO TABS: 5.0000 mg | ORAL_TABLET | ORAL | Status: DC | PRN

## 2015-12-24 MED ORDER — DIBUCAINE 1 % RE OINT: 1.0000 "application " | TOPICAL_OINTMENT | RECTAL | Status: DC | PRN

## 2015-12-24 MED ORDER — ACETAMINOPHEN 325 MG PO TABS: 650.0000 mg | ORAL_TABLET | ORAL | Status: DC | PRN

## 2015-12-24 MED ORDER — GENTAMICIN SULFATE 40 MG/ML IJ SOLN
Freq: Once | INTRAVENOUS | Status: AC
  Administered 2015-12-24: 217 mL via INTRAVENOUS
  Filled 2015-12-24: qty 8.5

## 2015-12-24 MED ORDER — WITCH HAZEL-GLYCERIN EX PADS: 1.0000 "application " | MEDICATED_PAD | CUTANEOUS | Status: DC | PRN

## 2015-12-24 MED ORDER — SCOPOLAMINE 1 MG/3DAYS TD PT72
1.0000 | MEDICATED_PATCH | Freq: Once | TRANSDERMAL | Status: DC
  Filled 2015-12-24: qty 1

## 2015-12-24 MED ORDER — PROMETHAZINE HCL 25 MG/ML IJ SOLN: 6.2500 mg | INTRAMUSCULAR | Status: DC | PRN

## 2015-12-24 MED ORDER — NITROGLYCERIN 0.4 MG/SPRAY TL SOLN
Status: DC | PRN
  Administered 2015-12-24: 3 via SUBLINGUAL

## 2015-12-24 MED ORDER — DIPHENHYDRAMINE HCL 25 MG PO CAPS: 25.0000 mg | ORAL_CAPSULE | Freq: Four times a day (QID) | ORAL | Status: DC | PRN

## 2015-12-24 MED ORDER — MIDAZOLAM HCL 2 MG/2ML IJ SOLN: 0.5000 mg | Freq: Once | INTRAMUSCULAR | Status: DC | PRN

## 2015-12-24 MED ORDER — ONDANSETRON HCL 4 MG PO TABS: 4.0000 mg | ORAL_TABLET | ORAL | Status: DC | PRN

## 2015-12-24 MED ORDER — NITROGLYCERIN 0.4 MG/SPRAY TL SOLN
Status: AC
  Filled 2015-12-24: qty 4.9

## 2015-12-24 MED ORDER — FENTANYL CITRATE (PF) 100 MCG/2ML IJ SOLN: 25.0000 ug | INTRAMUSCULAR | Status: DC | PRN

## 2015-12-24 MED ORDER — NALBUPHINE HCL 10 MG/ML IJ SOLN: 5.0000 mg | Freq: Once | INTRAMUSCULAR | Status: DC | PRN

## 2015-12-24 MED ORDER — DIPHENHYDRAMINE HCL 50 MG/ML IJ SOLN: 12.5000 mg | INTRAMUSCULAR | Status: DC | PRN

## 2015-12-24 MED ORDER — KETOROLAC TROMETHAMINE 30 MG/ML IJ SOLN: 30.0000 mg | Freq: Four times a day (QID) | INTRAMUSCULAR | Status: AC | PRN

## 2015-12-24 MED ORDER — DEXAMETHASONE SODIUM PHOSPHATE 10 MG/ML IJ SOLN
INTRAMUSCULAR | Status: AC
  Filled 2015-12-24: qty 1

## 2015-12-24 MED ORDER — SODIUM CHLORIDE 0.9% FLUSH: 3.0000 mL | INTRAVENOUS | Status: DC | PRN

## 2015-12-24 MED ORDER — SODIUM CHLORIDE 0.9% FLUSH
INTRAVENOUS | Status: AC
  Filled 2015-12-24: qty 3

## 2015-12-24 MED ORDER — DEXAMETHASONE SODIUM PHOSPHATE 10 MG/ML IJ SOLN
INTRAMUSCULAR | Status: DC | PRN
  Administered 2015-12-24: 8 mg via INTRAVENOUS

## 2015-12-24 MED ORDER — SODIUM BICARBONATE 8.4 % IV SOLN
INTRAVENOUS | Status: DC | PRN
  Administered 2015-12-24: 5 mL via EPIDURAL

## 2015-12-24 MED ORDER — LACTATED RINGERS IV SOLN
INTRAVENOUS | Status: DC
  Administered 2015-12-24: 01:00:00 via INTRAUTERINE

## 2015-12-24 MED ORDER — IBUPROFEN 600 MG PO TABS
600.0000 mg | ORAL_TABLET | Freq: Four times a day (QID) | ORAL | Status: DC
  Administered 2015-12-24 – 2015-12-26 (×8): 600 mg via ORAL
  Filled 2015-12-24 (×8): qty 1

## 2015-12-24 MED ORDER — SENNOSIDES-DOCUSATE SODIUM 8.6-50 MG PO TABS
2.0000 | ORAL_TABLET | ORAL | Status: DC
  Administered 2015-12-24 – 2015-12-25 (×2): 2 via ORAL
  Filled 2015-12-24 (×2): qty 2

## 2015-12-24 MED ORDER — PHENYLEPHRINE HCL 10 MG/ML IJ SOLN
INTRAMUSCULAR | Status: DC | PRN
  Administered 2015-12-24: .08 mg via INTRAVENOUS
  Administered 2015-12-24: .04 mg via INTRAVENOUS
  Administered 2015-12-24: .08 mg via INTRAVENOUS

## 2015-12-24 MED ORDER — FENTANYL CITRATE (PF) 100 MCG/2ML IJ SOLN
INTRAMUSCULAR | Status: AC
  Filled 2015-12-24: qty 2

## 2015-12-24 MED ORDER — SODIUM BICARBONATE 8.4 % IV SOLN
INTRAVENOUS | Status: AC
  Filled 2015-12-24: qty 50

## 2015-12-24 MED ORDER — OXYTOCIN 40 UNITS IN LACTATED RINGERS INFUSION - SIMPLE MED
INTRAVENOUS | Status: AC
  Filled 2015-12-24: qty 1000

## 2015-12-24 MED ORDER — BENZOCAINE-MENTHOL 20-0.5 % EX AERO
1.0000 "application " | INHALATION_SPRAY | CUTANEOUS | Status: DC | PRN
  Administered 2015-12-24: 1 via TOPICAL
  Filled 2015-12-24: qty 56

## 2015-12-24 MED ORDER — LIDOCAINE-EPINEPHRINE (PF) 2 %-1:200000 IJ SOLN
INTRAMUSCULAR | Status: AC
  Filled 2015-12-24: qty 20

## 2015-12-24 MED ORDER — SIMETHICONE 80 MG PO CHEW: 80.0000 mg | CHEWABLE_TABLET | ORAL | Status: DC | PRN

## 2015-12-24 MED ORDER — OXYCODONE HCL 5 MG PO TABS: 10.0000 mg | ORAL_TABLET | ORAL | Status: DC | PRN

## 2015-12-24 SURGICAL SUPPLY — 17 items
ADAPTER VACURETTE TBG SET 14 (CANNULA) ×4 IMPLANT
CLOTH BEACON ORANGE TIMEOUT ST (SAFETY) ×4 IMPLANT
GLOVE BIO SURGEON STRL SZ 6.5 (GLOVE) ×3 IMPLANT
GLOVE BIO SURGEONS STRL SZ 6.5 (GLOVE) ×1
GLOVE BIOGEL PI IND STRL 7.0 (GLOVE) ×2 IMPLANT
GLOVE BIOGEL PI INDICATOR 7.0 (GLOVE) ×2
GOWN STRL REUS W/TWL LRG LVL3 (GOWN DISPOSABLE) ×8 IMPLANT
NS IRRIG 1000ML POUR BTL (IV SOLUTION) ×4 IMPLANT
PACK VAGINAL MINOR WOMEN LF (CUSTOM PROCEDURE TRAY) ×4 IMPLANT
PAD OB MATERNITY 4.3X12.25 (PERSONAL CARE ITEMS) ×4 IMPLANT
SET BERKELEY SUCTION TUBING (SUCTIONS) ×4 IMPLANT
SUT VICRYL RAPIDE 3-0 36IN (SUTURE) ×4 IMPLANT
TOWEL OR 17X24 6PK STRL BLUE (TOWEL DISPOSABLE) ×4 IMPLANT
TRAY FOLEY CATH SILVER 14FR (SET/KITS/TRAYS/PACK) ×4 IMPLANT
TUBE VACURETTE 2ND TRIMESTER (CANNULA) ×8 IMPLANT
VACURETTE 12 RIGID CVD (CANNULA) ×4 IMPLANT
VACURETTE 16MM ASPIR CVD .5 (CANNULA) ×4 IMPLANT

## 2015-12-24 NOTE — Progress Notes (Signed)
Patient ID: Martha Mason, female   DOB: [birth year], 40 y.o.   MRN: [id number] Pt resting comfortably. No complaints. Peanut ball in place. Copious clear leakage of fluid noted VS-- 133-154/61-74 EFM - 145, moderate variability, no decels,+accels, cat 1 TOCO- contractions q 1-64mins SVE- ant lip/100/0  A/P: Labor down then start pushing - pt comfortable         AM labetalol due at 8am         Anticipate svd

## 2015-12-24 NOTE — Progress Notes (Signed)
Patient ID: Martha Mason, female   DOB: 06-29-1975, 40 y.o.   MRN: [id number]  Pt has reached C/C/+2.  Will start pushing  FHTs: 140's, good var, category 1 Toco: Q 2-70min  SVE 10/100/+2  Anticipate SVD

## 2015-12-24 NOTE — Anesthesia Preprocedure Evaluation (Signed)
Anesthesia Evaluation  Patient identified by MRN, date of birth, ID band Patient awake    Reviewed: Allergy & Precautions, NPO status , Patient's Chart, lab work & pertinent test resultsPreop documentation limited or incomplete due to emergent nature of procedure.  History of Anesthesia Complications Negative for: history of anesthetic complications  Airway Mallampati: I  TM Distance: >3 FB Neck ROM: Full    Dental  (+) Dental Advisory Given   Pulmonary neg pulmonary ROS,    breath sounds clear to auscultation       Cardiovascular (-) hypertension(-) angina+ Valvular Problems/Murmurs MVP  Rhythm:Regular Rate:Normal     Neuro/Psych negative neurological ROS     GI/Hepatic negative GI ROS,   Endo/Other  negative endocrine ROS  Renal/GU negative Renal ROS     Musculoskeletal   Abdominal   Peds  Hematology 300cc blood loss   Anesthesia Other Findings   Reproductive/Obstetrics Retained placenta post partum                             Anesthesia Physical Anesthesia Plan  ASA: II and emergent  Anesthesia Plan: Epidural   Post-op Pain Management:    Induction:   Airway Management Planned:   Additional Equipment:   Intra-op Plan:   Post-operative Plan:   Informed Consent: I have reviewed the patients History and Physical, chart, labs and discussed the procedure including the risks, benefits and alternatives for the proposed anesthesia with the patient or authorized representative who has indicated his/her understanding and acceptance.   Dental advisory given  Plan Discussed with: CRNA and Surgeon  Anesthesia Plan Comments: (Plan routine monitors, existing LEA)        Anesthesia Quick Evaluation

## 2015-12-24 NOTE — Lactation Note (Signed)
This note was copied from a baby's chart. Lactation Consultation Note I initial visit at 10 hours of age.  Mom reports breast changes during pregnancy and was able to hand express for baby to have syringe feeding earlier.  Mom wakened baby and placed STS with hand expression first and drops of colostrum noted.  Baby latched well in football hold on right breast. Mom first reported pain with shallow latch and LC assisted with deeper latch and more comfortable for mom.  Mom needed to stimulate baby to maintain feeding.  [facility] LC resources given and discussed.  Encouraged to feed with early cues on demand.  Early newborn behavior discussed. Mom to call for assist as needed. '  Patient Name: Martha Mason Today's Date: 12/24/2015 Reason for consult: Initial assessment   Maternal Data Has patient been taught Hand Expression?: Yes Does the patient have breastfeeding experience prior to this delivery?: No  Feeding Feeding Type: Breast Fed Length of feed:  (several minues observered)  LATCH Score/Interventions Latch: Grasps breast easily, tongue down, lips flanged, rhythmical sucking.  Audible Swallowing: A few with stimulation Intervention(s): Skin to skin;Hand expression;Alternate breast massage  Type of Nipple: Everted at rest and after stimulation  Comfort (Breast/Nipple): Soft / non-tender     Hold (Positioning): Assistance needed to correctly position infant at breast and maintain latch. Intervention(s): Breastfeeding basics reviewed;Support Pillows;Position options;Skin to skin  LATCH Score: 8  Lactation Tools Discussed/Used     Consult Status Consult Status: Follow-up Date: 12/25/15 Follow-up type: In-patient    Jannifer Rodney 12/24/2015, 9:12 PM

## 2015-12-24 NOTE — Addendum Note (Signed)
Addendum  created 12/24/15 1325 by Earmon Phoenix, CRNA   Anesthesia Event deleted, Anesthesia Intra Meds edited

## 2015-12-24 NOTE — Progress Notes (Signed)
Retained placenta.  Manual removal attempted.  D&C at bedside

## 2015-12-24 NOTE — Transfer of Care (Signed)
Immediate Anesthesia Transfer of Care Note  Patient: Martha Mason  Procedure(s) Performed: Procedure(s): DILATATION AND CURETTAGE (N/A)  Patient Location: PACU  Anesthesia Type:MAC and Epidural  Level of Consciousness: awake, alert  and patient cooperative  Airway & Oxygen Therapy: Patient Spontanous Breathing  Post-op Assessment: Report given to RN and Post -op Vital signs reviewed and stable  Post vital signs: Reviewed and stable  Last Vitals:  Vitals:   12/24/15 1046 12/24/15 1058  BP: (!) 154/94 (!) 148/89  Pulse: 91 93  Resp: 18 18  Temp:      Last Pain:  Vitals:   12/24/15 1058  TempSrc:   PainSc: 0-No pain         Complications: No apparent anesthesia complications

## 2015-12-24 NOTE — Progress Notes (Signed)
Notified Anesthesiologist Dr. Desmond Lope because pt epidural catheter was still in. Ordered to get a CBC now and if Platelets was over 150 the epidural can be pulled.

## 2015-12-24 NOTE — Progress Notes (Signed)
Patient ID: Martha Mason, female   DOB: 02-27-1976, 40 y.o.   MRN: [id number] Change in fetal heart rate noted. Difficulty monitoring with abdominal monitor so FSE placed. Baseline noted to be in 110s - change noted.   Some variable and late decels noted with spontaneous return to baseline; good variability; cat 2 strip  TOCO contractions q 2-42mins SVE - 5/90/-2  VS - 147/74  A/P: G1P0 at 37 4/7wks with preeclampsia progressing in labor         Counselled on amnioinfusion - will bolus with and then run at         Continue with expectant management

## 2015-12-24 NOTE — Progress Notes (Signed)
Patient ID: Martha Mason, female   DOB: Sep 30, 1975, 40 y.o.   MRN: [id number] Shortly after amnioinfusion started, pt's BP dropped into the 120s/60s.  FHT dropped into 90s and would not resolve with position changes. Pitocin was stoppd and 02 via mask started. Scalp stimulation done. FHTs improved; good variability  TOCO was monitored for next then pitocin restarted at ( had been at ).   SVE was done at the time and pt was 5.5cm.  Just rechecked and is now 8cm dilated/ 100/-1. Pitocin back up to 10 EFM -145, +accels, -decels, cat 1 VSS 130-142/58-68  Will continue with expectant management.

## 2015-12-24 NOTE — Anesthesia Postprocedure Evaluation (Signed)
Anesthesia Post Note  Patient: Martha Mason  Procedure(s) Performed: * No procedures listed *  Patient location during evaluation: L&D Anesthesia Type: Epidural Level of consciousness: awake and alert, oriented and patient cooperative Pain management: pain level controlled Vital Signs Assessment: post-procedure vital signs reviewed and stable Respiratory status: nonlabored ventilation, respiratory function stable and spontaneous breathing Cardiovascular status: blood pressure returned to baseline and stable Anesthetic complications: no Comments: Pt continued epidural for retained placenta, removed in OR.  See post op note from OR portion of care.      Last Vitals:  Vitals:   12/24/15 1550 12/24/15 1756  BP: (!) 147/95 (!) 160/81  Pulse: 90 100  Resp: 20   Temp: 37.2 C 37.6 C    Last Pain:  Vitals:   12/24/15 1756  TempSrc: Oral  PainSc:    Pain Goal: Patients Stated Pain Goal: 3 (12/24/15 1751)               Erling Cruz. Fenris Cauble

## 2015-12-24 NOTE — Anesthesia Postprocedure Evaluation (Signed)
Anesthesia Post Note  Patient: Martha Mason  Procedure(s) Performed: Procedure(s) (LRB): DILATATION AND CURETTAGE (N/A)  Patient location during evaluation: PACU Anesthesia Type: Epidural Level of consciousness: awake and alert, oriented and patient cooperative Pain management: pain level controlled Vital Signs Assessment: post-procedure vital signs reviewed and stable Respiratory status: spontaneous breathing, nonlabored ventilation and respiratory function stable Cardiovascular status: blood pressure returned to baseline and stable Postop Assessment: no signs of nausea or vomiting, patient able to bend at knees and epidural receding Anesthetic complications: no     Last Vitals:  Vitals:   12/24/15 1245 12/24/15 1300  BP: 137/83 140/89  Pulse: 78 81  Resp: 17 16  Temp:      Last Pain:  Vitals:   12/24/15 1245  TempSrc:   PainSc: 3    Pain Goal:                 Elvan Ebron,E. Zoe Nordin

## 2015-12-25 ENCOUNTER — Encounter (HOSPITAL_COMMUNITY): Payer: Self-pay

## 2015-12-25 LAB — CBC
HCT: 28.7 % — ABNORMAL LOW (ref 36.0–46.0)
Hemoglobin: 10.3 g/dL — ABNORMAL LOW (ref 12.0–15.0)
MCH: 34.3 pg — AB (ref 26.0–34.0)
MCHC: 35.9 g/dL (ref 30.0–36.0)
MCV: 95.7 fL (ref 78.0–100.0)
PLATELETS: 151 10*3/uL (ref 150–400)
RBC: 3 MIL/uL — AB (ref 3.87–5.11)
RDW: 13.3 % (ref 11.5–15.5)
WBC: 22.8 10*3/uL — AB (ref 4.0–10.5)

## 2015-12-25 MED ORDER — GELATIN ABSORBABLE 12-7 MM EX MISC
CUTANEOUS | Status: AC
  Filled 2015-12-25: qty 1

## 2015-12-25 MED ORDER — SUCROSE 24% NICU/PEDS ORAL SOLUTION
OROMUCOSAL | Status: AC
  Filled 2015-12-25: qty 1

## 2015-12-25 MED ORDER — LIDOCAINE 1% INJECTION FOR CIRCUMCISION
INJECTION | INTRAVENOUS | Status: AC
Start: 2015-12-25 — End: 2015-12-25
  Filled 2015-12-25: qty 1

## 2015-12-25 MED ORDER — CALCIUM CARBONATE ANTACID 500 MG PO CHEW
400.0000 mg | CHEWABLE_TABLET | Freq: Three times a day (TID) | ORAL | Status: DC
  Administered 2015-12-25 – 2015-12-26 (×2): 400 mg via ORAL
  Filled 2015-12-25 (×3): qty 2

## 2015-12-25 MED ORDER — ACETAMINOPHEN FOR CIRCUMCISION 160 MG/5 ML
ORAL | Status: AC
  Filled 2015-12-25: qty 1.25

## 2015-12-25 NOTE — Anesthesia Postprocedure Evaluation (Signed)
Anesthesia Post Note  Patient: Martha Mason  Procedure(s) Performed: Procedure(s) (LRB): DILATATION AND CURETTAGE (N/A)  Patient location during evaluation: Mother Baby Anesthesia Type: Epidural Level of consciousness: awake and alert and oriented Pain management: satisfactory to patient Vital Signs Assessment: post-procedure vital signs reviewed and stable Respiratory status: spontaneous breathing and nonlabored ventilation Cardiovascular status: stable Postop Assessment: no headache, no backache, no signs of nausea or vomiting, adequate PO intake and patient able to bend at knees (patient up walking) Anesthetic complications: no     Last Vitals:  Vitals:   12/24/15 2343 12/25/15 0530  BP: 133/65 132/65  Pulse: 91 87  Resp: 20 20  Temp: 37.2 C 36.6 C    Last Pain:  Vitals:   12/25/15 0753  TempSrc:   PainSc: Asleep   Pain Goal: Patients Stated Pain Goal: 3 (12/24/15 1840)               Madison Hickman

## 2015-12-25 NOTE — Progress Notes (Signed)
Received a call from Dr. Ellyn Hack to pass on to next nurse to discontinue foley cath when patient is awake and up and moving. Placed order to discontinue.

## 2015-12-25 NOTE — Progress Notes (Signed)
[name] was referred for history of depression/anxiety.  Referral is screened out by Clinical Social Worker because none of the following criteria appear to apply: -History of anxiety/depression during this pregnancy, or of post-partum depression. - Diagnosis of anxiety and/or depression within last 3 years - History of depression due to pregnancy loss/loss of child or -[name]'s symptoms are currently being treated with medication and/or therapy.  [name] is being treated with medication and active on admission.  Zoloft 50mg .  Please contact the Clinical Social Worker if needs arise or upon [name] request.   Deretha Emory, MSW Clinical Social Work: System Insurance underwriter for [employer] social worker [phone]

## 2015-12-25 NOTE — Progress Notes (Signed)
Pt up to void but could not void. Fundus firm, to the right but non distended. No complaints of pain or urgency to void. Bladder scanned with result of 736m. Attempted I/O cath per orders but met resistance. Received an unmeasurable amount of drops of urine. L&D nurse NLanelle Balwas called to come do I/O but same thing happened when trying to do I/O cath. Dr. BMelba Coonnotified and came to bedisde to evaluate.

## 2015-12-25 NOTE — Brief Op Note (Signed)
12/23/2015 - 12/24/2015  2:13 AM  PATIENT:  Martha Mason  40 y.o. female  PRE-OPERATIVE DIAGNOSIS:  retained placenta  POST-OPERATIVE DIAGNOSIS:  retained placenta  PROCEDURE:  Procedure(s): DILATATION AND CURETTAGE (N/A); IntraOp US guidance  SURGEON:  Surgeon(s) and Role:    * Sherian Rein, MD - Primary  ANESTHESIA:   epidural  EBL:  300cc  DRAINS: Urinary Catheter (Foley)   SPECIMEN:  Source of Specimen:  endometrial currettings  DISPOSITION OF SPECIMEN:  PATHOLOGY  COUNTS:  YES  TOURNIQUET:  * No tourniquets in log *  DICTATION: .Other Dictation: Dictation Number [id number]  PLAN OF CARE: Admit to inpatient   PATIENT DISPOSITION:  PACU - hemodynamically stable.   Delay start of Pharmacological VTE agent (>24hrs) due to surgical blood loss or risk of bleeding: not applicable

## 2015-12-25 NOTE — Op Note (Signed)
[name], [name] NO.:  [id number]  MEDICAL RECORD NO.:  [id number]  LOCATION:  9139                          FACILITY:  [facility]  PHYSICIAN:  Sherron Monday, MD        DATE OF BIRTH:  07-31-1975  DATE OF PROCEDURE:  12/24/2015 DATE OF DISCHARGE:                              OPERATIVE REPORT   PREOPERATIVE DIAGNOSIS:  Retained placenta.  POSTOPERATIVE DIAGNOSIS:  Retained placenta.  PROCEDURES:  D and C, exam under anesthesia with ultrasound guidance.  SURGEON:  Sherron Monday, MD  ANESTHESIA:  Epidural.  IV FLUIDS:  Per anesthesia note.  EBL:  Approximately 300 mL.  URINE OUTPUT:  Foley catheter was placed.  Please see anesthesia note.  PATHOLOGY:  Endometrial curettings.  COMPLICATIONS:  None.  DESCRIPTION OF PROCEDURE:  After informed consent was reviewed, the patient including risks, benefits and alternatives of the surgical procedure.  Following vaginal delivery, her placenta was unable to be delivered.  The cord was avulsed and in the room and intrauterine, an attempt was made to extract the placenta manually, it was unable to be removed.  A Lounsbury curette was also used.  This was also unsuccessful.  She was transported; however, pieces of the placenta were removed and bleeding was considered within a safe amount.  She was transported back to the OR after informed consent was reviewed including discussion of abnormal placentation and possibilities of surgery.  In the OR after epidural had been redosed, she was placed in the [place] stirrups, prepped and draped in the normal sterile fashion and ultrasound machine with the tech was also in the room.  Using an open, her uterus was noted to be deviated to the right and manual exploration of the cavity revealed likely placental tissue and ultrasound revealed the same using a combination of suction curette and manual extraction as well as the Lounsbury curette.  Minimal amounts of placental tissue  were found; however, the ultrasound revealed a normal stripe.  The patient tolerated the procedure well.  Sponge, lap and needle counts were correct x2 per the operating staff.     Sherron Monday, MD     JB/MEDQ  D:  12/25/2015  T:  12/25/2015  Job:  [id number]

## 2015-12-25 NOTE — Progress Notes (Signed)
Patient ID: Martha Mason, female   DOB: [birth year], 40 y.o.   MRN: [id number] Pt had a couple of episodes of urge incontinence this AM since foley catheter removed.  Has had some large voids of 700cc and 1000cc so suspect bladder was just getting over full.   Improved this PM, advise pt to attempt to void more frequently q 2-3 hours.  Fluid still leaking a bit at epidural site, but improving, no HA--anesthesia aware and thinks likely edema with no sx.

## 2015-12-25 NOTE — Progress Notes (Signed)
Patient ID: Martha Mason, female   DOB: 1975-11-23, 40 y.o.   MRN: [id number]  CTSP secondary to decreased uop, no return from I&O cath and 700 cc on bladder scan.  MD performed bedside US revealed uterus with thin EMS noted, ? Fibroids post wall, somewhat bulky.  No enlarged bladder.    VB WNL Uterine fundus firm at umbilicus.  Uterus deviated to R, mobile  Foley catheter placed, clear urine in tube.  Will monitor urine output and d/c in AM.  BP reasonable, induced for PreEclampsia.

## 2015-12-25 NOTE — Lactation Note (Signed)
This note was copied from a baby's chart. Lactation Consultation Note  Patient Name: Martha Mason [id number]'T Date: 12/25/2015 Reason for consult: Follow-up assessment Baby at 31 hr of life and not feeding well. Mom reports bilateral sore nipples and baby has been sleeping all day. Mom is concerned that baby has a "lip tie". Baby does have a thick upper labial frenulum but can flange lips with minimal effort. Baby has a normal palate, can lift tongue to roof, extend tongue past gum ridge, has good lateralization of tongue, and nice peristolic tongue movement. Attempted latch even though baby was not cueing because mom wanted to try. Baby would take 2 sucks, mom would yelp, and take baby off the breast. After 15 minutes of trying baby was swaddled and given to a visitor. Discussed baby behavior, feeding frequency, pumping, manual express/spoon feeding, baby belly size, voids, wt loss, breast changes, and nipple care. She is aware of lactation services and support group. She will call for help as needed.    Maternal Data    Feeding Feeding Type: Breast Fed Length of feed: 0 min  LATCH Score/Interventions Latch: Too sleepy or reluctant, no latch achieved, no sucking elicited. Intervention(s): Skin to skin;Teach feeding cues;Waking techniques  Audible Swallowing: None Intervention(s): Skin to skin;Hand expression Intervention(s): Alternate breast massage  Type of Nipple: Everted at rest and after stimulation  Comfort (Breast/Nipple): Filling, red/small blisters or bruises, mild/mod discomfort  Problem noted: Mild/Moderate discomfort Interventions (Mild/moderate discomfort): Hand expression  Hold (Positioning): Full assist, staff holds infant at breast Intervention(s): Support Pillows;Position options  LATCH Score: 3  Lactation Tools Discussed/Used WIC Program: No   Consult Status Consult Status: Follow-up Date: 12/26/15 Follow-up type: In-patient    Rulon Eisenmenger 12/25/2015, 6:00 PM

## 2015-12-25 NOTE — Progress Notes (Signed)
Post Partum Day 1 Subjective: no complaints, up ad lib, tolerating PO and pain controlled, uop picked up overnight - d/c foley this am.  C/O neck pain from pushing.  nl lochia.    Objective: Blood pressure 132/65, pulse 87, temperature 97.8 F (36.6 C), temperature source Oral, resp. rate 20, height 5\' 9"  (1.753 m), weight 72.1 kg (159 lb), SpO2 97 %, unknown if currently breastfeeding.  Physical Exam:  General: alert and no distress Lochia: appropriate Uterine Fundus: firm    Recent Labs  12/24/15 2125 12/25/15 0543  HGB 11.3* 10.3*  HCT 32.3* 28.7*    Assessment/Plan: Plan for discharge tomorrow, Breastfeeding and Lactation consult.  Routine care.  D/w pt circumcision for female infant including r/b/a   LOS: 2 days   Bovard-Stuckert, Martha Mason 12/25/2015, 7:09 AM

## 2015-12-25 NOTE — Addendum Note (Signed)
Addendum  created 12/25/15 0835 by Shanon Payor, CRNA   Charge Capture section accepted, Sign clinical note, Visit diagnoses modified

## 2015-12-25 NOTE — Progress Notes (Signed)
Called to room because pt said her epidural site was leaking. Pt had a 2X2 and tape over epidural site which was saturated with clear fluid leaking when  touching area. No bleeding noted. Pt states no headache or pain at this time. Notified Dr. Desmond Lope Anethesiologist. Told to place 4x4 dressing over site. No further orders at this time.

## 2015-12-26 ENCOUNTER — Encounter (HOSPITAL_COMMUNITY): Payer: Self-pay

## 2015-12-26 MED ORDER — LABETALOL HCL 100 MG PO TABS
100.0000 mg | ORAL_TABLET | Freq: Two times a day (BID) | ORAL | 1 refills | Status: DC
Start: 2015-12-26 — End: 2016-01-19

## 2015-12-26 MED ORDER — IBUPROFEN 600 MG PO TABS: 600.0000 mg | ORAL_TABLET | Freq: Four times a day (QID) | ORAL | 0 refills | Status: DC

## 2015-12-26 NOTE — Discharge Instructions (Signed)
As per discharge pamphlet °

## 2015-12-26 NOTE — Discharge Summary (Signed)
OB Discharge Summary     Patient Name: Martha Mason DOB: October 31, 1975 MRN: [id number]  Date of admission: 12/23/2015 Delivering MD: Sherian Rein   Date of discharge: 12/26/2015  Admitting diagnosis: INDUCTION Intrauterine pregnancy: [redacted]w[redacted]d     Secondary diagnosis:  Principal Problem:   SVD (spontaneous vaginal delivery) Active Problems:   Preeclampsia   Retained placenta after delivery without hemorrhage but with other complication      Discharge diagnosis: Term Pregnancy Delivered, Preeclampsia (mild) and retained placenta                                                                                                Post partum procedures:curettage   Augmentation: AROM and Pitocin  Complications: None  Hospital course:  Induction of Labor With Vaginal Delivery   40 y.o. yo G1P1001 at [redacted]w[redacted]d was admitted to the hospital 12/23/2015 for induction of labor.  Indication for induction: Preeclampsia.  Patient had an uncomplicated labor course as follows: Membrane Rupture Time/Date: 5:24 PM ,12/23/2015   Intrapartum Procedures: Episiotomy: None [1]                                         Lacerations:  2nd degree [3];Perineal [11]  Patient had delivery of a Viable infant.  Information for the patient's newborn:  [place], [place] [[id number]]      12/24/2015  Details of delivery can be found in separate delivery note.  She required D&C for retained placenta but did well after that.  BP remained controlled on low dose Labetalol. Patient is discharged home 12/26/15.   Physical exam Vitals:   12/25/15 0927 12/25/15 1328 12/25/15 1739 12/26/15 0523  BP: 127/70 119/60 132/81 135/61  Pulse: 86 82 (!) 109 71  Resp: 18 18 18 18   Temp: 98.2 F (36.8 C) 98 F (36.7 C) 98.3 F (36.8 C) 98.2 F (36.8 C)  TempSrc: Oral Oral Oral Oral  SpO2: 98% 96%    Weight:      Height:       General: alert Lochia: appropriate Uterine Fundus: firm  Labs: Lab Results  Component Value  Date   WBC 22.8 (H) 12/25/2015   HGB 10.3 (L) 12/25/2015   HCT 28.7 (L) 12/25/2015   MCV 95.7 12/25/2015   PLT 151 12/25/2015   CMP Latest Ref Rng & Units 12/23/2015  Glucose 65 - 99 mg/dL 98  BUN 6 - 20 mg/dL 54(Y)  Creatinine 5.03 - 1.00 mg/dL 5.46(F)  Sodium 681 - 275 mmol/L 136  Potassium 3.5 - 5.1 mmol/L 4.3  Chloride 101 - 111 mmol/L 107  CO2 22 - 32 mmol/L 22  Calcium 8.9 - 10.3 mg/dL 1.7(G)  Total Protein 6.5 - 8.1 g/dL 4.6(L)  Total Bilirubin 0.3 - 1.2 mg/dL 0.4  Alkaline Phos 38 - 126 U/L 171(H)  AST 15 - 41 U/L 43(H)  ALT 14 - 54 U/L 35    Discharge instruction: per After Visit Summary and "Baby and Me Booklet".  After visit meds:  Medication List    TAKE these medications   ibuprofen 600 MG tablet Commonly known as:  ADVIL,MOTRIN Take 1 tablet (600 mg total) by mouth every 6 (six) hours.   labetalol 100 MG tablet Commonly known as:  NORMODYNE Take 1 tablet (100 mg total) by mouth 2 (two) times daily. What changed:  when to take this   metoCLOPramide 10 MG tablet Commonly known as:  REGLAN Take 1 tablet (10 mg total) by mouth every 6 (six) hours as needed for nausea or vomiting (with/without symptoms of vertigo).   prenatal multivitamin Tabs tablet Take 1 tablet by mouth daily at 12 noon.   sertraline 50 MG tablet Commonly known as:  ZOLOFT Take 1 tablet by mouth daily.       Diet: routine diet  Activity: Advance as tolerated. Pelvic rest for 6 weeks.   Outpatient follow up:one week   Newborn Data: Live born female  Birth Weight: 6 lb 6.1 oz (2895 g) APGAR: 8, 9  Baby Feeding: Breast Disposition:home with mother   12/26/2015 Zenaida Niece, MD

## 2015-12-26 NOTE — Progress Notes (Signed)
Dr Council Mechanic, anesthesiologist to see pt about epidural site leaking clear fluid, removed wet dressing. Dressing with clear fluid present. New dressing applied by RN. Dr Council Mechanic states that pt can shower and we will change dressing again afterward, may see if lab can test fluid. Sherald Barge

## 2015-12-26 NOTE — Addendum Note (Signed)
Addendum  created 12/26/15 1147 by Sherrian Divers, MD   Sign clinical note

## 2015-12-26 NOTE — Progress Notes (Signed)
Called to see patient regarding leakage of clear fluid at epidural site.  Martha Mason had an epidural placed uneventfully at about 2200 on 8/31. Delivered vaginally at 1014 on 8/1. Had to go to OR about 1100 for retained placenta. Epidural worked well for labor and for retained placenta procedure.      Currently doing fine. No headache, sitting up in bed. No backache. No leg weakness.       Back: dressing wet but in place. Site non-red, non-tender. One drop of clear fluid observed. No blood noted.  A/P: Clear fluid drainage at epidural site. Most likely interstitial fluid. Doubt CSF fluid. No other worrisome symptoms.  Plan discharge. Keep site covered.  Call/return for headache, backache or if fluid discharge does not stop.

## 2015-12-26 NOTE — Lactation Note (Signed)
This note was copied from a baby's chart. Lactation Consultation Note; Mom 40 y/o IVF baby. Baby has been sleepy at the breast and slightly jaundiced. Baby nursed for 10 min on right breast but few swallows noted. Used feeding tube/syringe and baby more alert and vigorous at the breast. Took 12 cc's formula and going off to sleep. Mom with sore nipples- raw in very tips. Using coconut oil. Mom pumped with friends Ameda pump. Obtained about 10 ml. To use at next feeding. Reviewed supplement guidelines  Encouraged to feed with feeding cues and at least every 3 hours. Mom wants 2 week rental from [place] - completed. OP appointment made for Wed 8/3 at 2:30 pm. No questions at present. To call prn  Patient Name: Martha Mason [id number]'E Date: 12/26/2015 Reason for consult: Follow-up assessment   Maternal Data    Feeding Feeding Type: Breast Fed Length of feed: 20 min  LATCH Score/Interventions Latch: Grasps breast easily, tongue down, lips flanged, rhythmical sucking.  Audible Swallowing: A few with stimulation  Type of Nipple: Everted at rest and after stimulation  Comfort (Breast/Nipple): Filling, red/small blisters or bruises, mild/mod discomfort  Problem noted: Mild/Moderate discomfort Interventions (Mild/moderate discomfort): Hand expression (coconut oil)  Hold (Positioning): Assistance needed to correctly position infant at breast and maintain latch. Intervention(s): Breastfeeding basics reviewed  LATCH Score: 7  Lactation Tools Discussed/Used Tools: 58F feeding tube / Syringe Breast pump type: Double-Electric Breast Pump WIC Program: No   Consult Status Consult Status: Complete Date: 01/01/16 Follow-up type: Out-patient    Martha Mason 12/26/2015, 11:46 AM

## 2015-12-26 NOTE — Progress Notes (Signed)
PPD #2 No problems, bleeding and pain are ok, still with dressing on her back Afeb, VSS Fundus firm, NT at U-1 D/c home

## 2015-12-26 NOTE — Lactation Note (Signed)
This note was copied from a baby's chart. Lactation Consultation Note  new mom has small breast, limited breast tissue to center breast w/3 fingers or more space. Has more breast tissue to outer aspect of breast. Has everted nipples, hand expression taught w/colostrum noted. Mom c/o sore nipples. RN gave coconut oil. LC gave instructions on application and nipple care. Mom encouraged to feed baby 8-12 times/24 hours and with feeding cues. Mom encouraged to waken baby for feeds. Baby's weight has dropped down to 5.13lbs. Had 6 voids and 3 stools since birth. Jaundice in appearance. Bili serum drawn this am. Assisted mom in latching. Needed positioning assistance and flange to be wider. Lt. Nipple has red raw area to center of nipple. Mom has no fat stored in body. Slim and thin. Hand expressed 2 ml colostrum. Supplementation started w/LPI information feeding supplemental information. Mom chooses to syring and finger feed. Mom shown how to use DEBP & how to disassemble, clean, & reassemble parts. Mom knows to pump q3h for 15-20 min. Encouraged STS, strict I&O.  Feeding plan: Breast feed, occasionally massage during BF, hand expressed, give formula minus the colostrum amount, pump. Mom has a personal DEBP, encouraged to use hospital grade for stimulation. awaiting results for bili serum.  LC feels it would benefit mom and baby to stay another day for [facility] assistance.   Patient Name: Martha Mason SWNIO'E Date: 12/26/2015 Reason for consult: Follow-up assessment;Infant weight loss;Infant < 6lbs   Maternal Data    Feeding Feeding Type: Breast Milk with Formula added Length of feed: 20 min  LATCH Score/Interventions Latch: Grasps breast easily, tongue down, lips flanged, rhythmical sucking. Intervention(s): Skin to skin;Teach feeding cues;Waking techniques Intervention(s): Adjust position;Assist with latch;Breast massage;Breast compression  Audible Swallowing: A few with  stimulation Intervention(s): Skin to skin;Hand expression Intervention(s): Alternate breast massage  Type of Nipple: Everted at rest and after stimulation  Comfort (Breast/Nipple): Filling, red/small blisters or bruises, mild/mod discomfort  Problem noted: Mild/Moderate discomfort Interventions (Mild/moderate discomfort): Post-pump;Hand expression;Hand massage  Hold (Positioning): Assistance needed to correctly position infant at breast and maintain latch. Intervention(s): Skin to skin;Position options;Support Pillows;Breastfeeding basics reviewed  LATCH Score: 7  Lactation Tools Discussed/Used Tools: Pump Breast pump type: Double-Electric Breast Pump Pump Review: Setup, frequency, and cleaning;Milk Storage Initiated by:: Peri Jefferson RN IBCLC Date initiated:: 12/26/15   Consult Status Consult Status: Follow-up Date: 12/26/15 (in pm) Follow-up type: In-patient    Deetra Booton, Diamond Nickel 12/26/2015, 7:06 AM

## 2016-01-01 ENCOUNTER — Ambulatory Visit (HOSPITAL_COMMUNITY)
Admit: 2016-01-01 | Discharge: 2016-01-01 | Disposition: A | Discharge status: Home / Self Care | Payer: BC Managed Care – PPO | Attending: Internal Medicine | Admitting: Internal Medicine

## 2016-01-01 NOTE — Lactation Note (Signed)
Lactation Consult  Mother's reason for visit:  Difficulty latching Visit Type:  outpatient Appointment Notes:  See below Consult:  Initial Lactation Consultant:  Rolinda Roan, Martha Mason is 72 days old.  Came off phototherapy blanket on Sunday 8/6 and still has been sleepy at the breast.  4-5 voids today but no stools.  Parents are waking him for feeds every 3 hours.  Baby has had weight loss but as of yesterday his weight has stabilized.  Mother states she attempts to breastfeed every feeding but often he is too tired to latch.  Parents have been syringe feeding baby.  Mother has been pumping 4-5 times a day and receiving 12-15 ml.  She has been giving it to him with syringe and supplementing an additional 25-36 ml of formula.  Baby attempted latching but was too tired so gave baby some formula with slow flow nipple to wake him.  Then mother latched him (shallow) taught her to flange lips.  Baby only mouthed nipple so set up 5 fr feeding tube w/ formula.  Baby became more active at the breast.  Mother states she will mostly be alone during feedings so introduced double SNS.  Taught mother how to use and clean. Recommend increasing volume to give baby more energy to feed.  Baby took approx 54 ml during consult. Suggest 45-60 cc increasing as baby desires.  Monitor voids/stools. Use slow flow nipple bottle in addition to SNS at breast to give baby volume depending on infant's energy.  ________________________________________________________________________ ZOXW'R Name:  Martha Mason Date of Birth:  12/24/2017 Pediatrician:  Maisie Fus Gender:  female Gestational Age: [redacted]w[redacted]d (At Birth) Birth Weight:  6 lb 6.1 oz (2895 g) Weight at Discharge:  Weight: 5 lb 13.8 oz (2659 g)                                 Date of Discharge:  12/26/2015      Filed Weights   12/24/15 1014 12/25/15 0023 12/26/15 0000  Weight: 6 lb 6.1 oz (2895 g) 6 lb 2.8 oz (2800 g) 5 lb 13.8 oz (2659 g)  Last weight taken  from location outside of [facility]:  5 lb 15 oz     Location:Pediatrician's office Weight today:  6 lb 1.5 oz. ________________________________________________________________________  Mother's Name: Martha Mason Breastfeeding Experience:  primip ________________________________________________________________________  Breastfeeding History (Post Discharge)  Frequency of breastfeeding:  approx 6-8 times a day Duration of feeding:  20 min  Supplementation  Formula:  Volume 30-58ml Frequency:  8x per day Total volume per day:  240-231ml       Brand: Similac  Breastmilk:  Volume 12-15 ml Frequency:  4-5 times per dayTotal volume per day:  50-60 ml  Method:  Syringe,   Pumping  Type of pump:  Medela pump in style Frequency:  4-5 times per day Volume:  50-60 ml    Infant Intake and Output Assessment  Voids:  4-5 in 24 hrs.  Color:  Clear yellow Stools:  1-2 in 24 hrs.  ________________________________________________________________________  Maternal Breast Assessment  Breast:  Soft Nipple:  Erect Pain level:  0  _______________________________________________________________________ Feeding Assessment/Evaluation  Initial feeding assessment:  Infant's oral assessment:  WNL  Positioning:  Cross cradle Right breast  LATCH documentation:  Latch:  1 = Repeated attempts needed to sustain latch, nipple held in mouth throughout feeding, stimulation needed to elicit sucking reflex.  Audible swallowing:  1 = A few with stimulation  Type of nipple:  2 = Everted at rest and after stimulation  Comfort (Breast/Nipple):  2 = Soft / non-tender  Hold (Positioning):  1 = Assistance needed to correctly position infant at breast and maintain latch  LATCH score:  7  Attached assessment:  Shallow  Lips flanged:  Yes.    Lips untucked:  Yes.    Suck assessment:  Displays both  Tools:  Syringe with 5 Fr feeding tube, Supplemental nutrition system and Bottle Instructed  on use and cleaning of tool:  Yes.    Pre-feed weight:  2864 g  Post-feed weight:  2818 g  Amount transferred:  10 ml Amount supplemented:  44 ml  No  Total amount transferred:  10 ml Total supplement given:  44  ml

## 2016-01-07 ENCOUNTER — Ambulatory Visit: Payer: Self-pay

## 2016-01-07 NOTE — Lactation Note (Signed)
This note was copied from a baby's chart. Lactation Consultation Note - Pediatric Consult via phone - MD requested per Noralee StainSharon Hice RN, IBCLC and message  Passed on to the [facility] Idamae LusherMargaret Juanette Urizar, RN3, IBCLC to call Pedis  [facility] and spoke to the Ped[facility] RN - Verlon AuLeslie - baby is feeding mostly from a bottle 30- 50 ml ( formula an dif EBM available ) about every 3 hours. If feeds at the breast feeds sluggishly and Like he is sucking on a pacifier. Mom has a DEBP ( hospital ) set up and is pumping  Every 3 hours getting about 15 ml. @ home was using a SNS with formula and pumping with a DEBP rental.  In the hospital only bottles and pumping.  Baby's weight today 6-7 oz on 8/14. 1 oz above birth weight. ( 6-6 oz)  LC called mom in the hospital room- mom reports she had breast changes during pregnancy , milk has come in slowly with feeding with SNS and pumping.  In the hospital due to the baby being so sluggish at the breast she hasn't let him feed long and just feeding with a bottle. Also reports baby was taken up to 60 ml at home,  [facility] goal due to the baby's age.  For Now - feed the baby feeding with bottle and then latch afterwards. And continue pumping every 3 hours and when necessary until the baby is less sluggish , and gaining well.  LC recommended using the hospital nipples with small bottle from pump set up.  When D/C date is determined to consider making an LC O/P appt.via phone call.  LC also encouraged mom if she had questions to have Pedis RN call office or she could.  LC mom has a good understanding of LC Plan and is up for reevaluation . In the mean time protecting milk supply is important.   Patient Name: Martha Mason Today's Date: 01/07/2016     Maternal Data    Feeding    LATCH Score/Interventions                      Lactation Tools Discussed/Used     Consult Status      Kathrin Greathouseorio, Jennipher Weatherholtz Ann 01/07/2016, 3:13  PM

## 2016-01-10 ENCOUNTER — Inpatient Hospital Stay (HOSPITAL_COMMUNITY)
Admission: AD | Admit: 2016-01-10 | Payer: BC Managed Care – PPO | Source: Ambulatory Visit | Admitting: Obstetrics and Gynecology

## 2016-01-10 ENCOUNTER — Telehealth (HOSPITAL_COMMUNITY): Payer: Self-pay | Admitting: Lactation Services

## 2016-01-10 NOTE — Telephone Encounter (Signed)
Victorino DikeJennifer called wanting to schedule an outpatient appointment for follow up after baby was admitted to Pediatrics Unit.   Baby born at 37+ weeks, weighing 6 lbs 6 oz.  He had become sleepy and wasn't feeding well, became jaundiced, and more sleepy.  His lowest weight was 5 lbs 11 oz.  He spent a few days on Pediatrics.  The plan is to feed baby 15 mins maximum on the breast (so not to tire baby per MD), followed by double pumping (Symphony pump), and offering baby her EBM and 2 oz of formula by slow flow bottle.  Baby weighed 6 lbs 9 oz at Pediatrician office today.  OP appointment made to see Lactation Consultant 01/15/16 @ 9 am.

## 2016-01-15 ENCOUNTER — Encounter (HOSPITAL_COMMUNITY): Payer: Self-pay | Admitting: *Deleted

## 2016-01-15 ENCOUNTER — Ambulatory Visit (HOSPITAL_COMMUNITY): Admission: RE | Admit: 2016-01-15 | Payer: BC Managed Care – PPO | Source: Ambulatory Visit

## 2016-01-15 ENCOUNTER — Inpatient Hospital Stay (HOSPITAL_COMMUNITY)
Admission: AD | Admit: 2016-01-15 | Discharge: 2016-01-16 | DRG: 769 | Disposition: A | Discharge status: Home / Self Care | Payer: BC Managed Care – PPO | Source: Ambulatory Visit | Attending: Obstetrics and Gynecology | Admitting: Obstetrics and Gynecology

## 2016-01-15 DIAGNOSIS — F419 Anxiety disorder, unspecified: Secondary | ICD-10-CM | POA: Diagnosis present

## 2016-01-15 DIAGNOSIS — O99345 Other mental disorders complicating the puerperium: Secondary | ICD-10-CM | POA: Diagnosis present

## 2016-01-15 DIAGNOSIS — O43219 Placenta accreta, unspecified trimester: Secondary | ICD-10-CM

## 2016-01-15 HISTORY — DX: Placenta accreta, unspecified trimester: O43.219

## 2016-01-15 LAB — CBC
HEMATOCRIT: 23.4 % — AB (ref 36.0–46.0)
HEMOGLOBIN: 8.1 g/dL — AB (ref 12.0–15.0)
MCH: 33.3 pg (ref 26.0–34.0)
MCHC: 34.6 g/dL (ref 30.0–36.0)
MCV: 96.3 fL (ref 78.0–100.0)
Platelets: 309 10*3/uL (ref 150–400)
RBC: 2.43 MIL/uL — AB (ref 3.87–5.11)
RDW: 14.5 % (ref 11.5–15.5)
WBC: 15 10*3/uL — AB (ref 4.0–10.5)

## 2016-01-15 NOTE — MAU Note (Signed)
Pt. States that over the weekend she had a couple of instances of gushes of blood and blood clots.  She was given cytotec from the Dr. On Monday.  She took the cytotec and 1530.  Pt. States that right after she took it she started cramping and having large amount of bleeding.  She has been bleeding heavy since she took the cytotec and now she feel lightheaded.

## 2016-01-15 NOTE — MAU Provider Note (Signed)
Chief Complaint: No chief complaint on file.   First Provider Initiated Contact with Patient 01/15/16 2344      SUBJECTIVE HPI: Martha Mason is a 40 y.o. G1P1001 at Unknown who presents to Maternity Admissions reporting heavy vaginal bleeding with clots, lightheaded, weak.  Patient is postpartum day #23, NSVD with retained placenta and POD#22 for D&C for retained placenta.  Patient states her bleeding started a little over a week ago. Becoming heavier, with large amounts of clots. She's become weak, tired, and feels like her heart is racing. Subjective fevers/chills. Patient took cytotec at about 3PM today per OB/GYN instructionm, had some cramping and has been passing large clots since, unsure if tissue were in clots. Denies abdominal pain aside from cramping after cytotec was given.     Past Medical History:  Diagnosis Date  . Anxiety   . Endometrial polyp 06/30/2013  . Head injuries    Closed head injury   . Heart murmur   . Kidney stone   . Kidney stones   . Meningitis    At age 89  . Mitral valve prolapse   . MVP (mitral valve prolapse)   . Preeclampsia   . Retained placenta after delivery without hemorrhage but with other complication 12/25/2015  . Retained products of conception, postpartum 01/16/2016  . SVD (spontaneous vaginal delivery) 12/24/2015  . Vertigo    OB History  Gravida Para Term Preterm AB Living  1 1 1     1   SAB TAB Ectopic Multiple Live Births        0 1    # Outcome Date GA Lbr Len/2nd Weight Sex Delivery Anes PTL Lv  1 Term 12/24/15 [id number] 13:28 / 03:22 6 lb 6.1 oz (2.895 kg) M Vag-Spont EPI  LIV     Past Surgical History:  Procedure Laterality Date  . DILATATION & CURETTAGE/HYSTEROSCOPY WITH TRUECLEAR N/A 06/30/2013   Procedure: DILATATION & HYSTEROSCOPY/POLYPECTOMY WITH TRUCLEAR;  Surgeon: Robley FriesVaishali R Mody, MD;  Location: [facility];  Service: Gynecology;  Laterality: N/A;  . DILATION AND CURETTAGE OF UTERUS N/A 12/24/2015   Procedure: DILATATION AND  CURETTAGE;  Surgeon: Sherian ReinJody Bovard-Stuckert, MD;  Location: [facility];  Service: Obstetrics;  Laterality: N/A;  . NO PAST SURGERIES    . RENAL ARTERY STENT Left march 2014  . uterine polyp     Social History   Social History  . Marital status: Married    Spouse name: N/A  . Number of children: 0  . Years of education: Masters   Occupational History  . teacher     Social History Main Topics  . Smoking status: Never Smoker  . Smokeless tobacco: Never Used  . Alcohol use No  . Drug use: No  . Sexual activity: Yes   Other Topics Concern  . Not on file   Social History Narrative   Rare caffeine intake    No current facility-administered medications on file prior to encounter.    Current Outpatient Prescriptions on File Prior to Encounter  Medication Sig Dispense Refill  . ibuprofen (ADVIL,MOTRIN) 600 MG tablet Take 1 tablet (600 mg total) by mouth every 6 (six) hours. 30 tablet 0  . labetalol (NORMODYNE) 100 MG tablet Take 1 tablet (100 mg total) by mouth 2 (two) times daily. 60 tablet 1  . metoCLOPramide (REGLAN) 10 MG tablet Take 1 tablet (10 mg total) by mouth every 6 (six) hours as needed for nausea or vomiting (with/without symptoms of vertigo). (Patient not taking: Reported  on 12/23/2015) 30 tablet 0  . Prenatal Vit-Fe Fumarate-FA (PRENATAL MULTIVITAMIN) TABS tablet Take 1 tablet by mouth daily at 12 noon.    . sertraline (ZOLOFT) 50 MG tablet Take 1 tablet by mouth daily.     Allergies  Allergen Reactions  . Hydrocodone Other (See Comments)    Skin crawls  . Oxycodone     Feels like bugs crawling on her   . Keflex [Cephalexin] Palpitations    Patient unsure of allergy   . Tape Rash    Red splotches    I have reviewed the past Medical Hx, Surgical Hx, Social Hx, Allergies and Medications.   REVIEW OF SYSTEMS GENERAL: Weak, fatigued, tired OPHTHALMIC: negative for - blurry vision, decreased vision, double vision, photophobia or scotomata RESPIRATORY: no  cough, +shortness of breath, no wheezing CARDIOVASCULAR: no chest pain or dyspnea on exertion GASTROINTESTINAL: no abdominal pain, change in bowel habits, or black or bloody stools negative for - epigastric or RUQ pain GENITO-URINARY: no dysuria, trouble voiding, or hematuria negative for - genital discharge, vulvar/vaginal symptoms; + vaginal bleeding w/ large clots MUSKULOSKELETAL: negative for - gait disturbance or swelling in ankle - bilateral, foot - bilateral and leg - bilateral NEUROLOGICAL: negative for - dizziness, gait disturbance, headaches, numbness/tingling or visual changes DERMATOLOGICAL: negative   OBJECTIVE Patient Vitals for the past 24 hrs:  BP Temp Temp src Pulse Resp SpO2  01/15/16 2228 97/60 98.3 F (36.8 C) Oral 103 16 98 %    PHYSICAL EXAM Constitutional: Well-developed, well-nourished female, pale.  Cardiovascular: Tachycardic, regular rhythm, no murmurs.  Respiratory: normal rate and effort, CTAB GI: Abd soft, non-tender, gravid appropriate for gestational age. Pos BS x 4 MS: Extremities nontender, no edema, normal ROM Neurologic: Alert and oriented x 4.  GU: Neg CVAT.  SPECULUM EXAM: NEFG, about 50 cc dark red blood clots expressed from vagina with some small tissue products noted in clot.  BIMANUAL: cervix open about fingertip to 1 cm, normal appearance with dark red blood from os; uterus slightly enlarged, no adnexal tenderness or masses. No CMT.  LAB RESULTS Results for orders placed or performed during the hospital encounter of 01/15/16 (from the past 24 hour(s))  CBC     Status: Abnormal   Collection Time: 01/15/16 11:00 PM  Result Value Ref Range   WBC 15.0 (H) 4.0 - 10.5 K/uL   RBC 2.43 (L) 3.87 - 5.11 MIL/uL   Hemoglobin 8.1 (L) 12.0 - 15.0 g/dL   HCT 91.423.4 (L) 78.236.0 - 95.646.0 %   MCV 96.3 78.0 - 100.0 fL   MCH 33.3 26.0 - 34.0 pg   MCHC 34.6 30.0 - 36.0 g/dL   RDW 21.314.5 08.611.5 - 57.815.5 %   Platelets 309 150 - 400 K/uL  Type and screen [facility]     Status: None (Preliminary result)   Collection Time: 01/16/16  1:05 AM  Result Value Ref Range   ABO/RH(D) O POS    Antibody Screen NEG    Sample Expiration 01/19/2016    Unit Number [id number]    Blood Component Type RED CELLS,LR    Unit division 00    Status of Unit ISSUED    Transfusion Status OK TO TRANSFUSE    Crossmatch Result Compatible    Unit Number [id number]    Blood Component Type RBC LR PHER2    Unit division 00    Status of Unit ISSUED    Transfusion Status OK TO TRANSFUSE    Crossmatch  Result Compatible   Prepare RBC     Status: None   Collection Time: 01/16/16  3:30 AM  Result Value Ref Range   Order Confirmation ORDER PROCESSED BY BLOOD BANK     IMAGING US Pelvis Complete  Result Date: 01/16/2016 CLINICAL DATA:  Retained products of conception after delivery with complications. History of retained placenta post D and C 12/24/2015, with persistent heavy vaginal bleeding. EXAM: TRANSABDOMINAL ULTRASOUND OF PELVIS TECHNIQUE: Transabdominal ultrasound examination of the pelvis was performed including evaluation of the uterus, ovaries, adnexal regions, and pelvic cul-de-sac. COMPARISON:  None. FINDINGS: Uterus Measurements: 18 x 9 x 10. No evidence of fibroid or focal myometrial lesion. Endometrium Thickness: Thickened measuring 62 mm. Multiple rounded echogenic structures in the endometrium neck concerning for retained products of conception. The more distal lesion measures 6.6 x 5.7 x 7.8 cm. There is some peripheral flow. An additional lesion more proximally measures 5.3 x 3.2 x 5.5 cm. Right ovary Not visualized.  No adnexal mass. Left ovary Not visualized.  No adnexal mass. Other findings:  No abnormal free fluid. IMPRESSION: Thickened endometrium with at least 2 endometrial masses. Sonographic findings are compatible with retained products of conception in the correct clinical setting. Electronically Signed   By: Rubye Oaks M.D.    On: 01/16/2016 01:09   [place] Intraoperative  Result Date: 12/24/2015 CLINICAL DATA:  Ultrasound was provided for use by the ordering physician, and a technical charge was applied by the performing facility.  No radiologist interpretation/professional services rendered.    MAU COURSE IVF SSE TVUS CBC  10:40 AM - Reviewed preliminary [place] results.  10:44 AM - Spoke with Dr. Ellyn Hack, who is on her way in to take patient to the OR.   MDM Plan of care reviewed with patient, including labs and tests ordered and medical treatment. Care transferred to Dr. Ellyn Hack   ASSESSMENT 1. Retained products of conception after delivery with complications   2. Retained products of conception, postpartum     PLAN To the OR for D&C, under Dr. Emeline Darling care.    [address] [place], [place] 01/16/2016  12:01 AM

## 2016-01-16 ENCOUNTER — Encounter (HOSPITAL_COMMUNITY)
Admission: AD | Disposition: A | Discharge status: Home / Self Care | Payer: Self-pay | Source: Ambulatory Visit | Attending: Obstetrics and Gynecology

## 2016-01-16 ENCOUNTER — Inpatient Hospital Stay (HOSPITAL_COMMUNITY): Payer: BC Managed Care – PPO | Admitting: Anesthesiology

## 2016-01-16 ENCOUNTER — Encounter (HOSPITAL_COMMUNITY): Payer: Self-pay | Admitting: Obstetrics and Gynecology

## 2016-01-16 ENCOUNTER — Inpatient Hospital Stay (HOSPITAL_COMMUNITY): Payer: BC Managed Care – PPO

## 2016-01-16 DIAGNOSIS — O43219 Placenta accreta, unspecified trimester: Secondary | ICD-10-CM

## 2016-01-16 DIAGNOSIS — O99345 Other mental disorders complicating the puerperium: Secondary | ICD-10-CM | POA: Diagnosis present

## 2016-01-16 DIAGNOSIS — F419 Anxiety disorder, unspecified: Secondary | ICD-10-CM | POA: Diagnosis present

## 2016-01-16 HISTORY — DX: Placenta accreta, unspecified trimester: O43.219

## 2016-01-16 HISTORY — PX: DILATION AND EVACUATION: SHX1459

## 2016-01-16 LAB — PREPARE RBC (CROSSMATCH)

## 2016-01-16 LAB — CBC WITH DIFFERENTIAL/PLATELET
BASOS ABS: 0 10*3/uL (ref 0.0–0.1)
BASOS PCT: 0 %
EOS PCT: 0 %
Eosinophils Absolute: 0 10*3/uL (ref 0.0–0.7)
HEMATOCRIT: 21.7 % — AB (ref 36.0–46.0)
HEMOGLOBIN: 7.8 g/dL — AB (ref 12.0–15.0)
LYMPHS PCT: 12 %
Lymphs Abs: 2.5 10*3/uL (ref 0.7–4.0)
MCH: 33.1 pg (ref 26.0–34.0)
MCHC: 35.9 g/dL (ref 30.0–36.0)
MCV: 91.9 fL (ref 78.0–100.0)
MONOS PCT: 5 %
Monocytes Absolute: 1.1 10*3/uL — ABNORMAL HIGH (ref 0.1–1.0)
NEUTROS PCT: 83 %
Neutro Abs: 17.6 10*3/uL — ABNORMAL HIGH (ref 1.7–7.7)
Other: 0 %
Platelets: 250 10*3/uL (ref 150–400)
RBC: 2.36 MIL/uL — ABNORMAL LOW (ref 3.87–5.11)
RDW: 15.8 % — ABNORMAL HIGH (ref 11.5–15.5)
WBC: 21.2 10*3/uL — ABNORMAL HIGH (ref 4.0–10.5)

## 2016-01-16 LAB — CBC
HEMATOCRIT: 25.2 % — AB (ref 36.0–46.0)
HEMOGLOBIN: 8.9 g/dL — AB (ref 12.0–15.0)
MCH: 32.6 pg (ref 26.0–34.0)
MCHC: 35.3 g/dL (ref 30.0–36.0)
MCV: 92.3 fL (ref 78.0–100.0)
Platelets: 271 10*3/uL (ref 150–400)
RBC: 2.73 MIL/uL — ABNORMAL LOW (ref 3.87–5.11)
RDW: 15.3 % (ref 11.5–15.5)
WBC: 18.3 10*3/uL — ABNORMAL HIGH (ref 4.0–10.5)

## 2016-01-16 LAB — CREATININE, SERUM: Creatinine, Ser: 0.9 mg/dL (ref 0.44–1.00)

## 2016-01-16 LAB — HCG, QUANTITATIVE, PREGNANCY: hCG, Beta Chain, Quant, S: 1407 m[IU]/mL — ABNORMAL HIGH (ref <5)

## 2016-01-16 LAB — AST: AST: 25 U/L (ref 15–41)

## 2016-01-16 LAB — BUN: BUN: 15 mg/dL (ref 6–20)

## 2016-01-16 SURGERY — DILATION AND EVACUATION, UTERUS
Anesthesia: General | Site: Vagina

## 2016-01-16 MED ORDER — GENTAMICIN SULFATE 40 MG/ML IJ SOLN
INTRAVENOUS | Status: DC | PRN
  Administered 2016-01-16: 03:00:00 via INTRAVENOUS
  Administered 2016-01-16: 115 mL via INTRAVENOUS

## 2016-01-16 MED ORDER — FENTANYL CITRATE (PF) 100 MCG/2ML IJ SOLN: 25.0000 ug | INTRAMUSCULAR | Status: DC | PRN

## 2016-01-16 MED ORDER — TRAMADOL HCL 50 MG PO TABS: 50.0000 mg | ORAL_TABLET | Freq: Four times a day (QID) | ORAL | Status: DC | PRN

## 2016-01-16 MED ORDER — FENTANYL CITRATE (PF) 100 MCG/2ML IJ SOLN
INTRAMUSCULAR | Status: DC | PRN
  Administered 2016-01-16: 50 ug via INTRAVENOUS
  Administered 2016-01-16: 25 ug via INTRAVENOUS
  Administered 2016-01-16: 50 ug via INTRAVENOUS
  Administered 2016-01-16: 25 ug via INTRAVENOUS
  Administered 2016-01-16: 50 ug via INTRAVENOUS

## 2016-01-16 MED ORDER — LIDOCAINE HCL 2 % IJ SOLN
INTRAMUSCULAR | Status: DC | PRN
  Administered 2016-01-16: 10 mL

## 2016-01-16 MED ORDER — SIMETHICONE 80 MG PO CHEW: 80.0000 mg | CHEWABLE_TABLET | Freq: Four times a day (QID) | ORAL | Status: DC | PRN

## 2016-01-16 MED ORDER — SODIUM CHLORIDE 0.9 % IV SOLN: Freq: Once | INTRAVENOUS | Status: DC

## 2016-01-16 MED ORDER — SUGAMMADEX SODIUM 200 MG/2ML IV SOLN
INTRAVENOUS | Status: DC | PRN
  Administered 2016-01-16: 140 mg via INTRAVENOUS

## 2016-01-16 MED ORDER — ALUM & MAG HYDROXIDE-SIMETH 200-200-20 MG/5ML PO SUSP: 30.0000 mL | ORAL | Status: DC | PRN

## 2016-01-16 MED ORDER — GENTAMICIN SULFATE 40 MG/ML IJ SOLN
Freq: Once | INTRAVENOUS | Status: DC
  Filled 2016-01-16: qty 9

## 2016-01-16 MED ORDER — SUGAMMADEX SODIUM 200 MG/2ML IV SOLN: INTRAVENOUS | Status: DC | PRN

## 2016-01-16 MED ORDER — TRAMADOL HCL 50 MG PO TABS: 50.0000 mg | ORAL_TABLET | Freq: Four times a day (QID) | ORAL | 0 refills | Status: DC | PRN

## 2016-01-16 MED ORDER — IBUPROFEN 800 MG PO TABS: 800.0000 mg | ORAL_TABLET | Freq: Three times a day (TID) | ORAL | 1 refills | Status: DC | PRN

## 2016-01-16 MED ORDER — METHOTREXATE INJECTION FOR WOMEN'S HOSPITAL
50.0000 mg/m2 | Freq: Once | INTRAMUSCULAR | Status: AC
  Administered 2016-01-16: 90 mg via INTRAMUSCULAR
  Filled 2016-01-16: qty 1.8

## 2016-01-16 MED ORDER — ROCURONIUM BROMIDE 100 MG/10ML IV SOLN
INTRAVENOUS | Status: DC | PRN
  Administered 2016-01-16: 5 mg via INTRAVENOUS
  Administered 2016-01-16: 35 mg via INTRAVENOUS

## 2016-01-16 MED ORDER — MENTHOL 3 MG MT LOZG: 1.0000 | LOZENGE | OROMUCOSAL | Status: DC | PRN

## 2016-01-16 MED ORDER — GUAIFENESIN 100 MG/5ML PO SOLN: 15.0000 mL | ORAL | Status: DC | PRN

## 2016-01-16 MED ORDER — SOD CITRATE-CITRIC ACID 500-334 MG/5ML PO SOLN
30.0000 mL | Freq: Once | ORAL | Status: AC
  Administered 2016-01-16: 30 mL via ORAL
  Filled 2016-01-16: qty 15

## 2016-01-16 MED ORDER — PROPOFOL 10 MG/ML IV BOLUS
INTRAVENOUS | Status: DC | PRN
  Administered 2016-01-16: 50 mg via INTRAVENOUS
  Administered 2016-01-16: 150 mg via INTRAVENOUS

## 2016-01-16 MED ORDER — PHENYLEPHRINE HCL 10 MG/ML IJ SOLN
INTRAMUSCULAR | Status: DC | PRN
  Administered 2016-01-16: 40 ug via INTRAVENOUS
  Administered 2016-01-16: 80 ug via INTRAVENOUS

## 2016-01-16 MED ORDER — ONDANSETRON HCL 4 MG/2ML IJ SOLN: 4.0000 mg | Freq: Four times a day (QID) | INTRAMUSCULAR | Status: DC | PRN

## 2016-01-16 MED ORDER — PROMETHAZINE HCL 25 MG/ML IJ SOLN: 6.2500 mg | INTRAMUSCULAR | Status: DC | PRN

## 2016-01-16 MED ORDER — LACTATED RINGERS IV SOLN
INTRAVENOUS | Status: DC
  Administered 2016-01-16: 1000 mL via INTRAVENOUS

## 2016-01-16 MED ORDER — KETOROLAC TROMETHAMINE 60 MG/2ML IM SOLN
INTRAMUSCULAR | Status: DC | PRN
  Administered 2016-01-16: 30 mg via INTRAMUSCULAR

## 2016-01-16 MED ORDER — PRENATAL MULTIVITAMIN CH
1.0000 | ORAL_TABLET | Freq: Every day | ORAL | Status: DC
  Administered 2016-01-16: 1 via ORAL
  Filled 2016-01-16: qty 1

## 2016-01-16 MED ORDER — LABETALOL HCL 100 MG PO TABS
100.0000 mg | ORAL_TABLET | Freq: Two times a day (BID) | ORAL | Status: DC
  Administered 2016-01-16: 100 mg via ORAL
  Filled 2016-01-16: qty 1

## 2016-01-16 MED ORDER — MIDAZOLAM HCL 5 MG/5ML IJ SOLN
INTRAMUSCULAR | Status: DC | PRN
  Administered 2016-01-16: 2 mg via INTRAVENOUS

## 2016-01-16 MED ORDER — LACTATED RINGERS IV SOLN
INTRAVENOUS | Status: DC
  Administered 2016-01-16 (×3): via INTRAVENOUS

## 2016-01-16 MED ORDER — LIDOCAINE 2% (20 MG/ML) 5 ML SYRINGE
INTRAMUSCULAR | Status: DC | PRN
  Administered 2016-01-16: 60 mg via INTRAVENOUS

## 2016-01-16 MED ORDER — FAMOTIDINE IN NACL 20-0.9 MG/50ML-% IV SOLN
20.0000 mg | Freq: Once | INTRAVENOUS | Status: AC
  Administered 2016-01-16: 20 mg via INTRAVENOUS
  Filled 2016-01-16: qty 50

## 2016-01-16 MED ORDER — LACTATED RINGERS IV BOLUS (SEPSIS)
1000.0000 mL | Freq: Once | INTRAVENOUS | Status: AC
  Administered 2016-01-16: 1000 mL via INTRAVENOUS

## 2016-01-16 MED ORDER — ONDANSETRON HCL 4 MG/2ML IJ SOLN
INTRAMUSCULAR | Status: DC | PRN
  Administered 2016-01-16: 4 mg via INTRAVENOUS

## 2016-01-16 MED ORDER — DEXAMETHASONE SODIUM PHOSPHATE 4 MG/ML IJ SOLN
INTRAMUSCULAR | Status: DC | PRN
  Administered 2016-01-16: 4 mg via INTRAVENOUS

## 2016-01-16 MED ORDER — ONDANSETRON HCL 4 MG PO TABS: 4.0000 mg | ORAL_TABLET | Freq: Four times a day (QID) | ORAL | Status: DC | PRN

## 2016-01-16 MED ORDER — SERTRALINE HCL 50 MG PO TABS
50.0000 mg | ORAL_TABLET | Freq: Every day | ORAL | Status: DC
  Administered 2016-01-16: 50 mg via ORAL
  Filled 2016-01-16 (×2): qty 1

## 2016-01-16 MED ORDER — SODIUM CHLORIDE 0.9 % IV SOLN
INTRAVENOUS | Status: DC | PRN
  Administered 2016-01-16: 03:00:00 via INTRAVENOUS

## 2016-01-16 MED ORDER — IBUPROFEN 800 MG PO TABS: 800.0000 mg | ORAL_TABLET | Freq: Three times a day (TID) | ORAL | Status: DC | PRN

## 2016-01-16 SURGICAL SUPPLY — 21 items
CATH ROBINSON RED A/P 16FR (CATHETERS) ×4 IMPLANT
CLOTH BEACON ORANGE TIMEOUT ST (SAFETY) ×4 IMPLANT
CONT SPEC PATH 64OZ SNAP LID (MISCELLANEOUS) ×4 IMPLANT
CONTAINER PREFILL 10% NBF 60ML (FORM) IMPLANT
GLOVE BIO SURGEON STRL SZ 6.5 (GLOVE) ×3 IMPLANT
GLOVE BIO SURGEONS STRL SZ 6.5 (GLOVE) ×1
GLOVE BIOGEL PI IND STRL 7.0 (GLOVE) ×2 IMPLANT
GLOVE BIOGEL PI IND STRL 8 (GLOVE) ×2 IMPLANT
GLOVE BIOGEL PI INDICATOR 7.0 (GLOVE) ×2
GLOVE BIOGEL PI INDICATOR 8 (GLOVE) ×2
GLOVE ECLIPSE 7.5 STRL STRAW (GLOVE) ×3 IMPLANT
GOWN STRL REUS W/TWL LRG LVL3 (GOWN DISPOSABLE) ×8 IMPLANT
KIT BERKELEY 1ST TRIMESTER 3/8 (MISCELLANEOUS) ×3 IMPLANT
PACK VAGINAL MINOR WOMEN LF (CUSTOM PROCEDURE TRAY) ×4 IMPLANT
PAD OB MATERNITY 4.3X12.25 (PERSONAL CARE ITEMS) ×4 IMPLANT
PAD PREP 24X48 CUFFED NSTRL (MISCELLANEOUS) ×4 IMPLANT
SET BERKELEY SUCTION TUBING (SUCTIONS) ×4 IMPLANT
TOP DISP BERKELEY (MISCELLANEOUS) ×4 IMPLANT
TOWEL OR 17X24 6PK STRL BLUE (TOWEL DISPOSABLE) ×8 IMPLANT
VACURETTE 12 RIGID CVD (CANNULA) ×4 IMPLANT
VACURETTE 8 RIGID CVD (CANNULA) ×4 IMPLANT

## 2016-01-16 NOTE — Transfer of Care (Signed)
Immediate Anesthesia Transfer of Care Note  Patient: Martha LivingJennifer W Mason  Procedure(s) Performed: Procedure(s): DILATATION AND CURETTAGE with ultrasound guide (N/A)  Patient Location: PACU  Anesthesia Type:General  Level of Consciousness: awake and sedated  Airway & Oxygen Therapy: Patient Spontanous Breathing and Patient connected to nasal cannula oxygen  Post-op Assessment: Report given to RN and Post -op Vital signs reviewed and stable  Post vital signs: Reviewed and stable  Last Vitals:  Vitals:   01/15/16 2228  BP: 97/60  Pulse: 103  Resp: 16  Temp: 36.8 C    Last Pain:  Vitals:   01/15/16 2237  TempSrc:   PainSc: 2          Complications: No apparent anesthesia complications

## 2016-01-16 NOTE — Consult Note (Signed)
Lactation note Mom was readmitted last night for prolonged vaginal bleeding and a D&C was done.  She received 2 units of packed cells.  Mom has had a low milk supply and she is post pumping and supplementing baby.  Symphony pump set up and initiated since she is currently not with baby.  She pumped for 15 minutes and obtained about 20 mls.  Mom voices much anxiety about her condition and possible outcome could be a hysterectomy.  Support given and encouraged to call with concerns prn.

## 2016-01-16 NOTE — Progress Notes (Signed)
Patient ID: Martha LivingJennifer W Mason, female   DOB: [birth year], 40 y.o.   MRN: [id number]  [facility], likely accreta/increta.  POD#1 d/w pt clinical situation   Pt states some bleeding, small clots, nursing describes as scant  AFVSS  gen NAD Hgb increased from 8.1-8.9 after 2units  Abd soft, FFNT  D/W pt abnormal placentation need to remove remaining placenta. Possible need for hysterectomy Will d/w REI and see if they feel other uterine conserving mgmt is feasible  Will touch base later today

## 2016-01-16 NOTE — Progress Notes (Addendum)
Assumed care of pt after receiving report from RN Carmelina DaneHamm, Jill. Pt resting quietly without complains. POC discussed with pt and So.   0900: MD at bs discussing POC and clarify questions.   1157: assisted pt up to bathroom. Voided without problems. Small amount of bleeding in chux noted. chux and undergarment changed.   1250: MD called to unit. Updated pt on status. In transit to hospital   1400: MD at bs discussing plan of care. Pt breastfeeding infant and family at bs.   1645: informed pt about not being able to take pnv with folic acid and to pump and dump breast milk for 4 days. Pt verbalizes understanding.

## 2016-01-16 NOTE — Anesthesia Postprocedure Evaluation (Signed)
Anesthesia Post Note  Patient: Martha Mason  Procedure(s) Performed: Procedure(s) (LRB): DILATATION AND Evacuation with  ultrasound guide (N/A)  Patient location during evaluation: [facility] Unit Anesthesia Type: General Level of consciousness: awake, awake and alert and oriented Pain management: pain level controlled Vital Signs Assessment: post-procedure vital signs reviewed and stable Respiratory status: spontaneous breathing, nonlabored ventilation and respiratory function stable Cardiovascular status: stable Postop Assessment: no headache, no backache, no signs of nausea or vomiting and adequate PO intake Anesthetic complications: no     Last Vitals:  Vitals:   01/16/16 1006 01/16/16 1355  BP: 91/65 (!) 106/53  Pulse: 89 95  Resp: 16 18  Temp: 36.7 C 37.3 C    Last Pain:  Vitals:   01/16/16 1355  TempSrc: Oral  PainSc:    Pain Goal: Patients Stated Pain Goal: 3 (01/16/16 0645)               Chyla Schlender

## 2016-01-16 NOTE — Brief Op Note (Signed)
01/15/2016 - 01/16/2016  4:22 AM  PATIENT:  Martha Mason  40 y.o. female  PRE-OPERATIVE DIAGNOSIS:  Retained Products of Conception  POST-OPERATIVE DIAGNOSIS:  Retained Products of Conception  PROCEDURE:  Procedure(s): DILATATION AND CURETTAGE with ultrasound guide (N/A)  SURGEON:  Surgeon(s) and Role:    * Sherian ReinJody Bovard-Stuckert, MD - Primary    * Lazaro ArmsLuther H Eure, MD - Assisting  ANESTHESIA:   general  EBL:  Total I/O In: 2607 [I.V.:2000; Blood:607] Out: 560 [Urine:60; Blood:500] I&O cath  BLOOD ADMINISTERED:2 units (apx 500) CC PRBC  DRAINS: none   LOCAL MEDICATIONS USED:  LIDOCAINE   SPECIMEN:  Source of Specimen:  Products of conception  DISPOSITION OF SPECIMEN:  PATHOLOGY  COUNTS:  YES  TOURNIQUET:  * No tourniquets in log *  DICTATION: .Other Dictation: Dictation Number [id number]  PLAN OF CARE: Admit to inpatient   PATIENT DISPOSITION:  PACU - hemodynamically stable.   Delay start of Pharmacological VTE agent (>24hrs) due to surgical blood loss or risk of bleeding: not applicable

## 2016-01-16 NOTE — Anesthesia Preprocedure Evaluation (Signed)
Anesthesia Evaluation  Patient identified by MRN, date of birth, ID band Patient awake    Reviewed: Allergy & Precautions, NPO status , Patient's Chart, lab work & pertinent test results  Airway Mallampati: II  TM Distance: >3 FB Neck ROM: Full    Dental  (+) Dental Advisory Given   Pulmonary neg pulmonary ROS,    breath sounds clear to auscultation       Cardiovascular + Valvular Problems/Murmurs MVP  Rhythm:Regular Rate:Normal     Neuro/Psych Anxiety negative neurological ROS     GI/Hepatic negative GI ROS, Neg liver ROS,   Endo/Other  negative endocrine ROS  Renal/GU negative Renal ROS     Musculoskeletal   Abdominal   Peds  Hematology  (+) anemia ,   Anesthesia Other Findings   Reproductive/Obstetrics                             Lab Results  Component Value Date   WBC 15.0 (H) 01/15/2016   HGB 8.1 (L) 01/15/2016   HCT 23.4 (L) 01/15/2016   MCV 96.3 01/15/2016   PLT 309 01/15/2016   Lab Results  Component Value Date   CREATININE 1.03 (H) 12/23/2015   BUN 21 (H) 12/23/2015   NA 136 12/23/2015   K 4.3 12/23/2015   CL 107 12/23/2015   CO2 22 12/23/2015    Anesthesia Physical Anesthesia Plan  ASA: III  Anesthesia Plan: General   Post-op Pain Management:    Induction: Intravenous  Airway Management Planned: Oral ETT  Additional Equipment:   Intra-op Plan:   Post-operative Plan: Extubation in OR  Informed Consent: I have reviewed the patients History and Physical, chart, labs and discussed the procedure including the risks, benefits and alternatives for the proposed anesthesia with the patient or authorized representative who has indicated his/her understanding and acceptance.   Dental advisory given  Plan Discussed with: CRNA  Anesthesia Plan Comments:         Anesthesia Quick Evaluation

## 2016-01-16 NOTE — Progress Notes (Signed)
Initial visit upon referral from Rita Oharaarole Melton, RN, to introduce spiritual care services and offer support.  Martha Mason quickly cut me off when I introduced myself stating that she had not had an opportunity to talk to her parents and she knew that I was fully aware of the situation.  I told her I was happy to offer support however she found most appropriate and excused myself to allow them to visit.  She has my card and stated that she would reach out to me.  Please page as further needs arise.  Martha ShapeAmanda M. Martha Mason, MasonDiv. [facility] Chaplain Pager [phone] Office [phone]     01/16/16 1435  Clinical Encounter Type  Visited With Patient and family together  Visit Type Initial  Spiritual Encounters  Spiritual Needs Emotional  Stress Factors  Patient Stress Factors Health changes;Loss of control

## 2016-01-16 NOTE — Anesthesia Procedure Notes (Signed)
Procedure Name: Intubation Date/Time: 01/16/2016 2:12 AM Performed by: Janeece AgeeWRAPE, Cortlyn Cannell W Pre-anesthesia Checklist: Patient identified, Emergency Drugs available, Suction available, Patient being monitored and Timeout performed Patient Re-evaluated:Patient Re-evaluated prior to inductionOxygen Delivery Method: Circle system utilized Preoxygenation: Pre-oxygenation with 100% oxygen Intubation Type: IV induction Ventilation: Mask ventilation without difficulty Laryngoscope Size: Mac and 3 Grade View: Grade II Tube type: Oral Tube size: 7.0 mm Number of attempts: 3 Airway Equipment and Method: Video-laryngoscopy Placement Confirmation: ETT inserted through vocal cords under direct vision,  positive ETCO2 and breath sounds checked- equal and bilateral Secured at: 21 cm Tube secured with: Tape Dental Injury: Teeth and Oropharynx as per pre-operative assessment  Difficulty Due To: Difficulty was unanticipated

## 2016-01-16 NOTE — H&P (Signed)
Martha Mason is a 40 y.o. female G1P1 s/p SVD with RPOC and heavy bleeding.  US in MAU shows large 6.6 x5x7.8 and 5x3x5 area of retained productas of conception.  Pt had SVD 12/24/15.  Hgb has decreased from 10 - 8.  D/W pt asnd husband r/b/a of RPOC and suction D&C, will proceed.    OB History    Gravida Para Term Preterm AB Living   1 1 1     1    SAB TAB Ectopic Multiple Live Births         0 1    SVD 12/24/15, cord avulsed had suction d&C with US guidance, now with VB and pain - heavy bleeding after cytotec.  No abn pap No STD  Past Medical History:  Diagnosis Date  . Anxiety   . Endometrial polyp 06/30/2013  . Head injuries    Closed head injury   . Heart murmur   . Kidney stone   . Kidney stones   . Meningitis    At age 89  . Mitral valve prolapse   . MVP (mitral valve prolapse)   . Preeclampsia   . Retained placenta after delivery without hemorrhage but with other complication 12/25/2015  . Retained products of conception, postpartum 01/16/2016  . SVD (spontaneous vaginal delivery) 12/24/2015  . Vertigo    Past Surgical History:  Procedure Laterality Date  . DILATATION & CURETTAGE/HYSTEROSCOPY WITH TRUECLEAR N/A 06/30/2013   Procedure: DILATATION & HYSTEROSCOPY/POLYPECTOMY WITH TRUCLEAR;  Surgeon: Robley FriesVaishali R Mody, MD;  Location: [facility];  Service: Gynecology;  Laterality: N/A;  . DILATION AND CURETTAGE OF UTERUS N/A 12/24/2015   Procedure: DILATATION AND CURETTAGE;  Surgeon: Sherian ReinJody Bovard-Stuckert, MD;  Location: [facility];  Service: Obstetrics;  Laterality: N/A;  . NO PAST SURGERIES    . RENAL ARTERY STENT Left march 2014  . uterine polyp     Family History: family history includes Cancer in her paternal grandfather; Heart attack in her father; Prostate cancer in her father; Stroke in her paternal grandmother. Social History:  reports that she has never smoked. She has never used smokeless tobacco. She reports that she does not drink alcohol or use drugs. married Meds  PNV, Zoloft, labetalol All hydrocodone, Keflex       Review of Systems  Constitutional: Positive for malaise/fatigue.  HENT: Negative.   Eyes: Negative.   Respiratory: Negative.   Cardiovascular: Negative.   Gastrointestinal: Positive for abdominal pain.  Genitourinary: Negative.   Musculoskeletal: Negative.   Skin: Negative.   Neurological: Positive for dizziness and weakness.  Psychiatric/Behavioral:       PPD   Maternal Medical History:  Reason for admission: Vaginal bleeding.       Blood pressure 97/60, pulse 103, temperature 98.3 F (36.8 C), temperature source Oral, resp. rate 16, SpO2 98 %, unknown if currently breastfeeding. Maternal Exam:  Abdomen: Patient reports generalized tenderness.  Introitus: Normal vulva. Normal vagina.    Physical Exam  Constitutional: She is oriented to person, place, and time. She appears well-developed and well-nourished.  HENT:  Head: Normocephalic and atraumatic.  Cardiovascular: Normal rate and regular rhythm.   Respiratory: Effort normal and breath sounds normal. No respiratory distress. She has no wheezes.  GI: Soft. Bowel sounds are normal. There is generalized tenderness.  Musculoskeletal: Normal range of motion.  Neurological: She is alert and oriented to person, place, and time.  Skin: Skin is warm and dry.  Psychiatric: She has a normal mood and affect. Her behavior  is normal.    Prenatal labs: ABO, Rh: --/--/O POS, O POS (07/31 0055) Antibody: NEG (07/31 0055)  Assessment/Plan: 40yo G1P1 s/p SVD with RPOC for suction D&C with US guidance Abx for prophylaxis - Clinda/gent D/w pt poss further decrease in HGB need for transfusion  Bovard-Stuckert, Aspasia Rude 01/16/2016, 1:31 AM

## 2016-01-16 NOTE — Consult Note (Signed)
Lactation note: Mom inquiring about safety of Methotrexate while breastfeeding.  Information from [employer] Medication in Mother's Milk given to RN to share with patient.  Drug is a L-4 and breastfeeding not recommended during treatment.  Mom should pump and dump her milk for 4 days after her last dose.

## 2016-01-16 NOTE — Progress Notes (Signed)
Patient ID: Martha LivingJennifer W Mason, female   DOB: 1975/10/06, 40 y.o.   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  Possible hysteroscopy and resection.  Will follow-up with Dr Allena KatzPatel in a week.  Methotrexate here and d/c this PM, check labs prior to Methotrexate.  D/W pt and FOB, husband [name], both voice understanding and wish to proceed.    Order set placed.  Bleeding precautions reviewed with patient  Bleeding reviewed with nurses small/scant  D/W pt risk of accreta/increta with future pregnancy, risks of carrying future pregnancy - will discuss further with Dr. Allena KatzPatel.

## 2016-01-16 NOTE — Discharge Summary (Signed)
Physician Discharge Summary  Patient ID: Martha Mason MRN: [id number] DOB/AGE: January 19, 1976 40 y.o.  Admit date: 01/15/2016 Discharge date: 01/16/2016  Admission Diagnoses: RPOC  Discharge Diagnoses:  Principal Problem:   Placenta accreta Active Problems:   Retained products of conception, postpartum   Retained products of conception after delivery with complications   Discharged Condition: stable  Hospital Course: Admitted after D&E and transfusion with likely accreta/increta.  Overnight HGB stable.  Phone consult with Dr Allena KatzPatel.  MTX prior to d/c.  D/C stable  Consults: [facility] (phone consult)  Significant Diagnostic Studies: labs: HCG, CBC  Treatments: IV hydration, surgery: D&C and blood transfusion  Discharge Exam: Blood pressure (!) 106/53, pulse 95, temperature 99.1 F (37.3 C), temperature source Oral, resp. rate 18, SpO2 97 %, unknown if currently breastfeeding. General appearance: alert and no distress GI: normal findings: bowel sounds normal and soft, FF  Disposition: 01-Home or Self Care  Discharge Instructions    Call MD for:    Complete by:  As directed   Heavy bleeding - greater than 1 pad/hr   Call MD for:  persistant nausea and vomiting    Complete by:  As directed   Call MD for:  severe uncontrolled pain    Complete by:  As directed   Diet - low sodium heart healthy    Complete by:  As directed   Discharge instructions    Complete by:  As directed   Call [phone] with questions or problems Call [phone] [facility] - Dr Allena KatzPatel) with questions or problems, to schedule f/u in 1 week   Increase activity slowly    Complete by:  As directed   May shower / Bathe    Complete by:  As directed   May walk up steps    Complete by:  As directed       Medication List    TAKE these medications   ibuprofen 800 MG tablet Commonly known as:  ADVIL,MOTRIN Take 1 tablet (800 mg total) by mouth every 8 (eight) hours as needed for moderate pain (mild  pain). What changed:  medication strength  how much to take  when to take this  reasons to take this   labetalol 100 MG tablet Commonly known as:  NORMODYNE Take 1 tablet (100 mg total) by mouth 2 (two) times daily.   prenatal multivitamin Tabs tablet Take 1 tablet by mouth daily at 12 noon.   sertraline 100 MG tablet Commonly known as:  ZOLOFT Take 100 mg by mouth daily.   traMADol 50 MG tablet Commonly known as:  ULTRAM Take 1 tablet (50 mg total) by mouth every 6 (six) hours as needed for moderate pain.      Follow-up Information    [facility] Follow up in 4 day(s).   Why:  for HCG (preg hormone level) Contact information: [address] [id number] [place] [id number] [phone]       Hoover BrownsBansari Gautam Patel, MD Follow up in 1 week(s).   Specialty:  Endocrinology Why:  for Ultrasound and labs, please call Contact information: [address] [place] [id number] [phone]           Signed: Sherian ReinBovard-Stuckert, Rusti Arizmendi 01/16/2016, 2:16 PM

## 2016-01-17 LAB — TYPE AND SCREEN
ABO/RH(D): O POS
Antibody Screen: NEGATIVE
Unit division: 0
Unit division: 0

## 2016-01-17 NOTE — Op Note (Signed)
NAMJacinto Reap:  Mason, Martha             ACCOUNT NO.:  [id number]  MEDICAL RECORD NO.:  [id number]  LOCATION:  [facility]                          [facility]ILITY:  [facility]  PHYSICIAN:  Sherron MondayJody Bovard, MD        DATE OF BIRTH:  August 02, 1975  DATE OF PROCEDURE:  01/16/2016 DATE OF DISCHARGE:                              OPERATIVE REPORT   PREOPERATIVE DIAGNOSIS:  Retained products of conception.  POSTOPERATIVE DIAGNOSIS:  Retained products of conception, abnormal placentation.  PROCEDURE:  Suction dilation and evacuation with ultrasound guidance.  INTRAOPERATIVE CONSULT:  Lazaro ArmsLuther H. Eure, M.D.  ANESTHESIA:  General.  IV FLUIDS:  1000 mL plus 607 mL of blood.  URINE OUTPUT:  No cath prior to the procedure.  ESTIMATED BLOOD LOSS:  Approximately 500 mL.  Approximately the patient received 2 units of blood during the procedure as her hemoglobin had been lowering and large blood loss.  ANESTHESIA:  Local anesthesia of 2% lidocaine, approximately 10 mL for paracervical block.  SPECIMEN:  Products of conception to pathology.  DESCRIPTION OF PROCEDURE:  After informed consent was reviewed with both the patient and her husband, she was transported to the operating room, placed on the table in supine position where general anesthesia was induced and found to be adequate.  She was then placed in Yellofin stirrups, prepped and draped in the normal sterile fashion.  Her cervix was noted to be well dilated and ultrasound being performed in the OR. At the time of the procedure revealed a large chunk of placenta at the right cornual fundal area, it was although as in the lower uterine segment.  The suction curettage was introduced in the lower products were easily evacuated.  The upper products were difficult to evacuate, switch over to a 12-French curette, also used a sharp curette with __________ curette and a large curette with minimal tissue being pulled out, several chunks of tissue, and the intraoperative  consult was called with Dr. Despina HiddenEure, who came and evaluated the situation and agreed with a likely abnormal placentation.  Patient was not having too much of bleeding.  The decision was made after she had received an intraoperative transfusion to admit her to the hospital and discussed with her plan of care in the morning with possible hysterectomy versus possible consult with Reproductive Endocrinology.  [facility] and a septoplasty while trying to conceive.  A several centimeter area of tissue was still noted to be at the fundus, however, bleeding was noted to be like normal lochia.  She was returned to the supine position awake and in stable condition and transported to the PACU.  Sponge, lap, and needle counts were correct x2.     Sherron MondayJody Bovard, MD     JB/MEDQ  D:  01/16/2016  T:  01/16/2016  Job:  [id number]

## 2016-01-19 ENCOUNTER — Inpatient Hospital Stay (HOSPITAL_COMMUNITY)
Admission: AD | Admit: 2016-01-19 | Discharge: 2016-01-19 | Disposition: A | Discharge status: Home / Self Care | Payer: BC Managed Care – PPO | Source: Ambulatory Visit | Attending: Obstetrics and Gynecology | Admitting: Obstetrics and Gynecology

## 2016-01-19 ENCOUNTER — Encounter (HOSPITAL_COMMUNITY): Payer: Self-pay | Admitting: *Deleted

## 2016-01-19 DIAGNOSIS — I341 Nonrheumatic mitral (valve) prolapse: Secondary | ICD-10-CM | POA: Diagnosis not present

## 2016-01-19 DIAGNOSIS — Z5189 Encounter for other specified aftercare: Secondary | ICD-10-CM | POA: Insufficient documentation

## 2016-01-19 DIAGNOSIS — K5900 Constipation, unspecified: Secondary | ICD-10-CM | POA: Diagnosis not present

## 2016-01-19 DIAGNOSIS — O909 Complication of the puerperium, unspecified: Secondary | ICD-10-CM | POA: Diagnosis not present

## 2016-01-19 DIAGNOSIS — F419 Anxiety disorder, unspecified: Secondary | ICD-10-CM | POA: Insufficient documentation

## 2016-01-19 LAB — CBC
HCT: 22.5 % — ABNORMAL LOW (ref 36.0–46.0)
HEMOGLOBIN: 7.7 g/dL — AB (ref 12.0–15.0)
MCH: 32.5 pg (ref 26.0–34.0)
MCHC: 34.2 g/dL (ref 30.0–36.0)
MCV: 94.9 fL (ref 78.0–100.0)
Platelets: 289 10*3/uL (ref 150–400)
RBC: 2.37 MIL/uL — AB (ref 3.87–5.11)
RDW: 14.9 % (ref 11.5–15.5)
WBC: 7.7 10*3/uL (ref 4.0–10.5)

## 2016-01-19 LAB — HCG, QUANTITATIVE, PREGNANCY: hCG, Beta Chain, Quant, S: 454 m[IU]/mL — ABNORMAL HIGH (ref <5)

## 2016-01-19 NOTE — MAU Note (Signed)
Patient here for follow up blood work, bleeding is much lighter, lower abdominal pain, constipated LBM Wednesday night, not taking a stool softener, light headed at times, feels heart racing.

## 2016-01-19 NOTE — MAU Provider Note (Signed)
History     CSN: [id number]  Arrival date and time: 01/19/16 1357  Initial contact: Spoke with patient 1445; labs ordered    Chief Complaint  Patient presents with  . Follow-up   Martha Mason is a 40 y.o. G1P1001 at 26 days postpartum NSVD after IOL for preeclampsia. She had retained placenta and D&C PPD#1. On 01/16/2016 presented with heavy bleeding and RPOC, likely placenta accreta/increta. Had cytotec and D&C. Hgb 8.9 after 2u PRBC. Last documented hgb 7.8 on 01/16/2016. Per C/W Dr. Allena Katz, received MTX. Here today for F/U quant. Has had ongoing orthostatic symptoms and not eating well. Vaginal bleeding is tapering to scant dark blood. Breastfeeding without problems and baby doing well. Has F/U with Dr. Allena Katz this week.      OB History  Gravida Para Term Preterm AB Living  1 1 1     1   SAB TAB Ectopic Multiple Live Births        0 1    # Outcome Date GA Lbr Len/2nd Weight Sex Delivery Anes PTL Lv  1 Term 12/24/15 [redacted]w[redacted]d 13:28 / 03:22 2.895 kg (6 lb 6.1 oz) M Vag-Spont EPI  LIV       Past Medical History:  Diagnosis Date  . Anxiety   . Endometrial polyp 06/30/2013  . Head injuries    Closed head injury   . Heart murmur   . Kidney stone   . Kidney stones   . Meningitis    At age 45  . Mitral valve prolapse   . MVP (mitral valve prolapse)   . 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 Preeclampsia   . Retained placenta after delivery without hemorrhage but with other complication 12/25/2015  . Retained products of conception, postpartum 01/16/2016  . SVD (spontaneous vaginal delivery) 12/24/2015  . Vertigo     Past Surgical History:  Procedure Laterality Date  . DILATATION & CURETTAGE/HYSTEROSCOPY WITH TRUECLEAR N/A 06/30/2013   Procedure: DILATATION & HYSTEROSCOPY/POLYPECTOMY WITH TRUCLEAR;  Surgeon: Robley Fries, MD;  Location: [facility];  Service: Gynecology;  Laterality: N/A;  . DILATION AND CURETTAGE OF UTERUS N/A 12/24/2015   Procedure:  DILATATION AND CURETTAGE;  Surgeon: Sherian Rein, MD;  [facility]: [facility];  Service: Obstetrics;  Laterality: N/A;  . NO PAST SURGERIES    . RENAL ARTERY STENT Left march 2014  . uterine polyp      Family History  Problem Relation Age of Onset  . Prostate cancer Father   . Heart attack Father   . Stroke Paternal Grandmother   . Cancer Paternal Grandfather     Social History  Substance Use Topics  . Smoking status: Never Smoker  . Smokeless tobacco: Never Used  . Alcohol use No    Allergies:  Allergies  Allergen Reactions  . Hydrocodone Other (See Comments)    Skin crawls  . Oxycodone     Feels like bugs crawling on her   . Keflex [Cephalexin] Palpitations    Patient unsure of allergy   . Tape Rash    Red splotches    Prescriptions Prior to Admission  Medication Sig Dispense Refill Last Dose  . ibuprofen (ADVIL,MOTRIN) 800 MG tablet Take 1 tablet (800 mg total) by mouth every 8 (eight) hours as needed for moderate pain (mild pain). 45 tablet 1   . labetalol (NORMODYNE) 100 MG tablet Take 1 tablet (100 mg total) by mouth 2 (two) times daily. 60 tablet 1 01/15/2016 at  0900  . Prenatal Vit-Fe Fumarate-FA (PRENATAL MULTIVITAMIN) TABS tablet Take 1 tablet by mouth daily at 12 noon.   01/15/2016 at Unknown time  . sertraline (ZOLOFT) 100 MG tablet Take 100 mg by mouth daily.   01/15/2016 at Unknown time  . traMADol (ULTRAM) 50 MG tablet Take 1 tablet (50 mg total) by mouth every 6 (six) hours as needed for moderate pain. 20 tablet 0     Review of Systems  Constitutional: Negative for chills and fever.  Eyes: Negative for blurred vision and double vision.  Respiratory: Negative for cough.   Cardiovascular: Negative for chest pain.  Gastrointestinal: Positive for constipation. Negative for diarrhea, heartburn and nausea.  Genitourinary: Negative for dysuria, frequency and urgency.  Neurological: Positive for dizziness and weakness. Negative for headaches.    Physical Exam   Blood pressure (!) 86/60, pulse 118, temperature 98.4 F (36.9 C), temperature source Oral, SpO2 100 %, unknown if currently breastfeeding. Vitals:   01/19/16 1420 01/19/16 1421 01/19/16 1422 01/19/16 1424  BP: 120/66 108/72 96/64 (!) 86/60  Pulse: 105 99 117 118  Temp:      TempSrc:      SpO2:         Physical Exam  Nursing note and vitals reviewed. Constitutional: She appears well-developed and well-nourished. No distress.  Eyes: Pupils are equal, round, and reactive to light.  Neck: Neck supple.  Cardiovascular: Normal rate.   See orthostatic VS changes  Respiratory: Effort normal.  GI: Soft. There is no tenderness. There is no guarding.  Genitourinary:  Genitourinary Comments: deferred  Musculoskeletal: Normal range of motion.  Neurological: She is alert.  Skin: Skin is warm and dry. There is pallor.    MAU Course  Procedures Results for orders placed or performed during the hospital encounter of 01/19/16 (from the past 24 hour(s))  hCG, quantitative, pregnancy     Status: Abnormal   Collection Time: 01/19/16  2:11 PM  Result Value Ref Range   hCG, Beta Chain, Quant, S 454 (H) <5 mIU/mL  CBC     Status: Abnormal   Collection Time: 01/19/16  2:14 PM  Result Value Ref Range   WBC 7.7 4.0 - 10.5 K/uL   RBC 2.37 (L) 3.87 - 5.11 MIL/uL   Hemoglobin 7.7 (L) 12.0 - 15.0 g/dL   HCT 16.1 (L) 09.6 - 04.5 %   MCV 94.9 78.0 - 100.0 fL   MCH 32.5 26.0 - 34.0 pg   MCHC 34.2 30.0 - 36.0 g/dL   RDW 40.9 81.1 - 91.4 %   Platelets 289 150 - 400 K/uL     Ref Range & Units 14:11 3d ago   hCG, Beta Chain, Quant, S <5 mIU/mL 454  <5 mIU/mL" class="[id number]">1,407CM    .  C/W Dr. Mindi Slicker discharge with reassurance. Call Dr. Eliane Decree office re: not needing labs tomorrow (scheduled tomorrow for labs only) and call Dr. Shella Spearing office to reschedule 8/30 visit with her to after her F/U with Dr. Allena Katz on 8/31. D/C labetalol  Assessment and Plan  G1P1001 26 days  postpartum 1. Retained products of conception, postpartum    Hemodynamically stable. Appropriate drop in quantitative beta hCG after MTX for RPOC Constipation    Medication List    STOP taking these medications   labetalol 100 MG tablet Commonly known as:  NORMODYNE   traMADol 50 MG tablet Commonly known as:  ULTRAM     TAKE these medications   ibuprofen 800 MG tablet Commonly known as:  ADVIL,MOTRIN Take 1 tablet (800 mg total) by mouth every 8 (eight) hours as needed for moderate pain (mild pain).   prenatal multivitamin Tabs tablet Take 1 tablet by mouth daily at 12 noon.   sertraline 100 MG tablet Commonly known as:  ZOLOFT Take 100 mg by mouth daily.         Waleed Dettman 01/19/2016, 2:43 PM

## 2016-01-19 NOTE — Discharge Instructions (Signed)
Postpartum Hemorrhage Postpartum hemorrhage is excessive blood loss after childbirth. Some blood loss is normal after delivering a baby. However, postpartum hemorrhage is a potentially serious condition.  CAUSES   A loss of muscle tone in the uterus after childbirth.  Failure to deliver all of the placenta.  Wounds in the birth canal caused by delivery of the fetus.  A maternal bleeding disorder that prevents blood clotting (rare). RISK FACTORS You are at greater risk for postpartum hemorrhage if you:  Have a history of postpartum hemorrhage.  Have delivered more than one baby.  Had preeclampsia or eclampsia.  Had problems with the placenta.  Had complications during your labor or delivery.  Are obese.  Are Asian or Hispanic. SIGNS AND SYMPTOMS  Vaginal bleeding after delivery is normal and should be expected. Bleeding (lochia) will occur for several days after childbirth. This can be expected with normal vaginal deliveries and cesarean deliveries.  You are bleeding too much after your delivery if you are:  Passing large clots or pieces of tissue. This may be small pieces of placenta left after delivery.   Soaking more than one sanitary pad per hour for several hours.   Having heavy, bright-red bleeding that occurs 4 days or more after delivery.   Having a discharge that has a bad smell or if you begin to run an unexplained fever.   Having times of lightheadedness or fainting, feeling short of breath, or having your heart beat fast with very little activity.  DIAGNOSIS  A diagnosis is based on your symptoms and a physical exam of your perineum, vagina, cervix, and uterus. Diagnostic tests may include:  Blood pressure and pulse.  Blood tests.  Blood clotting tests.  Ultrasonography. TREATMENT  Treatment is based on the severity of bleeding and may include:  Uterine massage.  Medicines.  Blood transfusions.  Sometimes bleeding occurs if portions of the  placenta are left behind in the uterus after delivery. If this happens, often a curettage or scraping of the inside of the uterus must be done. This usually stops the bleeding. If this treatment does not stop the bleeding, surgery (hysterectomy) may have to be performed to remove the uterus.  If bleeding is due to clotting or bleeding problems that are not related to the pregnancy, other treatments may be needed.  HOME CARE INSTRUCTIONS   Limit your activity as directed by your health care provider.Your health care provider may order bed rest (getting up to the bathroom only) or may allow you to continue light activity.   Keep track of the number of pads you use each day and how soaked (saturated) they are. Write this number down.   Do not use tampons. Do not douche or have sexual intercourse until approved by your health care provider.   Drink enough fluids to keep your urine clear or pale yellow.   Get proper amounts of rest.   Eat foods that are rich in iron, such as spinach, red meat, and legumes.  SEEK IMMEDIATE MEDICAL CARE IF:  You experience severe cramps in your stomach, back, or belly (abdomen).   You have a fever.   You pass large clots or tissue. Save any tissue for your health care provider to look at.   Your bleeding increases.  You become weak or lightheaded, or you pass out.   Your sanitary pad count per hour is increasing. MAKE SURE YOU:  Understand these instructions.  Will watch your condition.  Will get help right away if  you are not doing well or get worse. °  °This information is not intended to replace advice given to you by your health care provider. Make sure you discuss any questions you have with your health care provider. °  °Document Released: 08/01/2003 Document Revised: 05/16/2013 Document Reviewed: 10/27/2012 °Elsevier Interactive Patient Education ©2016 Elsevier Inc. ° °

## 2016-01-21 ENCOUNTER — Encounter (HOSPITAL_COMMUNITY): Payer: Self-pay | Admitting: Obstetrics and Gynecology

## 2016-02-04 DIAGNOSIS — O43109 Malformation of placenta, unspecified, unspecified trimester: Secondary | ICD-10-CM | POA: Insufficient documentation

## 2017-03-12 ENCOUNTER — Other Ambulatory Visit: Payer: Self-pay | Admitting: Physician Assistant

## 2017-03-12 ENCOUNTER — Ambulatory Visit
Admission: RE | Admit: 2017-03-12 | Discharge: 2017-03-12 | Disposition: A | Discharge status: Home / Self Care | Payer: BLUE CROSS/BLUE SHIELD | Source: Ambulatory Visit | Attending: Physician Assistant | Admitting: Physician Assistant

## 2017-03-12 DIAGNOSIS — R059 Cough, unspecified: Secondary | ICD-10-CM

## 2017-03-12 DIAGNOSIS — R05 Cough: Secondary | ICD-10-CM

## 2018-02-03 ENCOUNTER — Other Ambulatory Visit: Payer: Self-pay | Admitting: Obstetrics and Gynecology

## 2018-02-03 DIAGNOSIS — N6489 Other specified disorders of breast: Secondary | ICD-10-CM

## 2018-02-08 ENCOUNTER — Other Ambulatory Visit: Payer: Self-pay | Admitting: Obstetrics and Gynecology

## 2018-02-08 ENCOUNTER — Other Ambulatory Visit: Payer: BLUE CROSS/BLUE SHIELD

## 2018-02-08 DIAGNOSIS — N6489 Other specified disorders of breast: Secondary | ICD-10-CM

## 2018-02-08 DIAGNOSIS — N6310 Unspecified lump in the right breast, unspecified quadrant: Secondary | ICD-10-CM

## 2018-02-11 ENCOUNTER — Other Ambulatory Visit: Payer: Self-pay | Admitting: Obstetrics and Gynecology

## 2018-02-11 ENCOUNTER — Ambulatory Visit
Admission: RE | Admit: 2018-02-11 | Discharge: 2018-02-11 | Disposition: A | Discharge status: Home / Self Care | Payer: BC Managed Care – PPO | Source: Ambulatory Visit | Attending: Obstetrics and Gynecology | Admitting: Obstetrics and Gynecology

## 2018-02-11 ENCOUNTER — Ambulatory Visit
Admission: RE | Admit: 2018-02-11 | Discharge: 2018-02-11 | Disposition: A | Discharge status: Home / Self Care | Payer: BLUE CROSS/BLUE SHIELD | Source: Ambulatory Visit | Attending: Obstetrics and Gynecology | Admitting: Obstetrics and Gynecology

## 2018-02-11 DIAGNOSIS — N6489 Other specified disorders of breast: Secondary | ICD-10-CM

## 2018-02-11 DIAGNOSIS — N6310 Unspecified lump in the right breast, unspecified quadrant: Secondary | ICD-10-CM

## 2018-04-20 LAB — OB RESULTS CONSOLE GC/CHLAMYDIA
Chlamydia: NEGATIVE
Gonorrhea: NEGATIVE

## 2018-04-20 LAB — OB RESULTS CONSOLE RPR: RPR: NONREACTIVE

## 2018-04-20 LAB — OB RESULTS CONSOLE ABO/RH: RH Type: POSITIVE

## 2018-04-20 LAB — OB RESULTS CONSOLE RUBELLA ANTIBODY, IGM: Rubella: IMMUNE

## 2018-04-20 LAB — OB RESULTS CONSOLE ANTIBODY SCREEN: Antibody Screen: NEGATIVE

## 2018-04-20 LAB — OB RESULTS CONSOLE HIV ANTIBODY (ROUTINE TESTING): HIV: NONREACTIVE

## 2018-04-20 LAB — OB RESULTS CONSOLE HEPATITIS B SURFACE ANTIGEN: Hepatitis B Surface Ag: NEGATIVE

## 2018-08-15 ENCOUNTER — Telehealth: Payer: BC Managed Care – PPO | Admitting: Physician Assistant

## 2018-08-15 DIAGNOSIS — J069 Acute upper respiratory infection, unspecified: Secondary | ICD-10-CM

## 2018-08-15 DIAGNOSIS — Z3A27 27 weeks gestation of pregnancy: Secondary | ICD-10-CM

## 2018-08-15 NOTE — Progress Notes (Signed)
For the safety of you and your child, I recommend a face to face office visit with a health care provider.  Many mothers need to take medicines during their pregnancy and while nursing.  Almost all medicines pass into the breast milk in small quantities.  Most are generally considered safe for a mother to take but some medicines must be avoided.  After reviewing your E-Visit request, I recommend that you consult your primary care provider regarding in-person assessment as I feel you do need further assessment. I do not feel coronavirus testing is indicated by what you are telling me. [facility] is not running the testing tent presently so a test would need to be performed by your primary care provider.    NOTE: If you entered your credit card information for this eVisit, you will not be charged. You may see a "hold" on your card for the $35 but that hold will drop off and you will not have a charge processed.  If you are having a true medical emergency please call 911.  If you need an urgent face to face visit, [place] has four urgent care centers for your convenience.  If you need care fast and have a high deductible or no insurance consider:   [email or web address] to reserve your spot online an avoid wait times  [facility] [address] [place], [place] [address] 8 am to 8 pm Monday-Friday 10 am to 4 pm Saturday-Sunday *Across the street from [employer]  [address] [place], [address] 8 am to 5 pm Monday-Friday * In the [facility] on the [facility]   The following sites will take your  insurance:  . [facility]  [phone] Get Driving Directions Find a Provider at this Location  [address] [place], [place] [address] . 10 am to 8 pm Monday-Friday . 12 pm to 8 pm Saturday-Sunday   . [facility] at [facility]  [phone] Get Driving Directions Find a  Provider at this Location  [address], [address] [place], [place] [address] . 8 am to 8 pm Monday-Friday . 9 am to 6 pm Saturday . 11 am to 6 pm Sunday   . [facility] at [facility]  [phone] Get Driving Directions  [address] [place], [place] [address] . 8 am to 8 pm Monday-Friday . 8 am to 4 pm Saturday-Sunday   Your e-visit answers were reviewed by a board certified advanced clinical practitioner to complete your personal care plan.  Thank you for using e-Visits.

## 2018-09-08 DIAGNOSIS — Z1211 Encounter for screening for malignant neoplasm of colon: Secondary | ICD-10-CM | POA: Insufficient documentation

## 2018-09-08 DIAGNOSIS — Z3183 Encounter for assisted reproductive fertility procedure cycle: Secondary | ICD-10-CM | POA: Insufficient documentation

## 2018-10-09 LAB — OB RESULTS CONSOLE GBS: GBS: NEGATIVE

## 2018-10-18 ENCOUNTER — Telehealth (HOSPITAL_COMMUNITY): Payer: Self-pay | Admitting: *Deleted

## 2018-10-18 ENCOUNTER — Encounter (HOSPITAL_COMMUNITY): Payer: Self-pay | Admitting: *Deleted

## 2018-10-18 NOTE — Telephone Encounter (Signed)
Preadmission screen  

## 2018-10-19 ENCOUNTER — Other Ambulatory Visit: Payer: Self-pay

## 2018-10-19 ENCOUNTER — Ambulatory Visit (HOSPITAL_COMMUNITY)
Admission: RE | Admit: 2018-10-19 | Discharge: 2018-10-19 | Disposition: A | Discharge status: Home / Self Care | Payer: BC Managed Care – PPO | Source: Ambulatory Visit | Attending: Obstetrics and Gynecology | Admitting: Obstetrics and Gynecology

## 2018-10-19 DIAGNOSIS — Z1159 Encounter for screening for other viral diseases: Secondary | ICD-10-CM | POA: Diagnosis not present

## 2018-10-19 NOTE — MAU Note (Signed)
Covid swab collected. Pt tolerated well. PT asymptomatic 

## 2018-10-20 LAB — NOVEL CORONAVIRUS, NAA (HOSP ORDER, SEND-OUT TO REF LAB; TAT 18-24 HRS): SARS-CoV-2, NAA: NOT DETECTED

## 2018-10-21 ENCOUNTER — Other Ambulatory Visit: Payer: Self-pay

## 2018-10-21 ENCOUNTER — Other Ambulatory Visit (HOSPITAL_COMMUNITY): Payer: Self-pay | Admitting: *Deleted

## 2018-10-21 ENCOUNTER — Other Ambulatory Visit (HOSPITAL_COMMUNITY): Payer: Self-pay | Admitting: Obstetrics and Gynecology

## 2018-10-21 ENCOUNTER — Inpatient Hospital Stay (HOSPITAL_COMMUNITY)
Admission: AD | Admit: 2018-10-21 | Discharge: 2018-10-27 | DRG: 788 | Disposition: A | Discharge status: Home / Self Care | Payer: BC Managed Care – PPO | Attending: Obstetrics and Gynecology | Admitting: Obstetrics and Gynecology

## 2018-10-21 ENCOUNTER — Encounter (HOSPITAL_COMMUNITY): Payer: Self-pay

## 2018-10-21 DIAGNOSIS — O1494 Unspecified pre-eclampsia, complicating childbirth: Secondary | ICD-10-CM | POA: Diagnosis present

## 2018-10-21 DIAGNOSIS — O3403 Maternal care for unspecified congenital malformation of uterus, third trimester: Secondary | ICD-10-CM | POA: Diagnosis present

## 2018-10-21 DIAGNOSIS — Q519 Congenital malformation of uterus and cervix, unspecified: Secondary | ICD-10-CM

## 2018-10-21 DIAGNOSIS — O1493 Unspecified pre-eclampsia, third trimester: Secondary | ICD-10-CM | POA: Diagnosis present

## 2018-10-21 DIAGNOSIS — I341 Nonrheumatic mitral (valve) prolapse: Secondary | ICD-10-CM | POA: Diagnosis present

## 2018-10-21 DIAGNOSIS — O328XX0 Maternal care for other malpresentation of fetus, not applicable or unspecified: Secondary | ICD-10-CM | POA: Diagnosis present

## 2018-10-21 DIAGNOSIS — O9942 Diseases of the circulatory system complicating childbirth: Secondary | ICD-10-CM | POA: Diagnosis present

## 2018-10-21 DIAGNOSIS — R079 Chest pain, unspecified: Secondary | ICD-10-CM

## 2018-10-21 DIAGNOSIS — O1400 Mild to moderate pre-eclampsia, unspecified trimester: Secondary | ICD-10-CM | POA: Diagnosis present

## 2018-10-21 DIAGNOSIS — Z3A37 37 weeks gestation of pregnancy: Secondary | ICD-10-CM

## 2018-10-21 DIAGNOSIS — Z98891 History of uterine scar from previous surgery: Secondary | ICD-10-CM

## 2018-10-21 LAB — ABO/RH: ABO/RH(D): O POS

## 2018-10-21 LAB — CBC
HCT: 36.1 % (ref 36.0–46.0)
Hemoglobin: 12.3 g/dL (ref 12.0–15.0)
MCH: 33.2 pg (ref 26.0–34.0)
MCHC: 34.1 g/dL (ref 30.0–36.0)
MCV: 97.3 fL (ref 80.0–100.0)
Platelets: 254 10*3/uL (ref 150–400)
RBC: 3.71 MIL/uL — ABNORMAL LOW (ref 3.87–5.11)
RDW: 13.3 % (ref 11.5–15.5)
WBC: 8.5 10*3/uL (ref 4.0–10.5)
nRBC: 0 % (ref 0.0–0.2)

## 2018-10-21 LAB — COMPREHENSIVE METABOLIC PANEL
ALT: 20 U/L (ref 0–44)
AST: 35 U/L (ref 15–41)
Albumin: 1.8 g/dL — ABNORMAL LOW (ref 3.5–5.0)
Alkaline Phosphatase: 107 U/L (ref 38–126)
Anion gap: 9 (ref 5–15)
BUN: 22 mg/dL — ABNORMAL HIGH (ref 6–20)
CO2: 20 mmol/L — ABNORMAL LOW (ref 22–32)
Calcium: 8.7 mg/dL — ABNORMAL LOW (ref 8.9–10.3)
Chloride: 109 mmol/L (ref 98–111)
Creatinine, Ser: 1.06 mg/dL — ABNORMAL HIGH (ref 0.44–1.00)
GFR calc Af Amer: >60 mL/min (ref >60)
GFR calc non Af Amer: >60 mL/min (ref >60)
Glucose, Bld: 112 mg/dL — ABNORMAL HIGH (ref 70–99)
Potassium: 4 mmol/L (ref 3.5–5.1)
Sodium: 138 mmol/L (ref 135–145)
Total Bilirubin: 0.2 mg/dL — ABNORMAL LOW (ref 0.3–1.2)
Total Protein: 4.7 g/dL — ABNORMAL LOW (ref 6.5–8.1)

## 2018-10-21 MED ORDER — ONDANSETRON HCL 4 MG/2ML IJ SOLN: 4.0000 mg | Freq: Four times a day (QID) | INTRAMUSCULAR | Status: DC | PRN

## 2018-10-21 MED ORDER — LIDOCAINE HCL (PF) 1 % IJ SOLN: 30.0000 mL | INTRAMUSCULAR | Status: DC | PRN

## 2018-10-21 MED ORDER — OXYTOCIN 40 UNITS IN NORMAL SALINE INFUSION - SIMPLE MED: 2.5000 [IU]/h | INTRAVENOUS | Status: DC

## 2018-10-21 MED ORDER — SOD CITRATE-CITRIC ACID 500-334 MG/5ML PO SOLN
30.0000 mL | ORAL | Status: DC | PRN
  Administered 2018-10-22: 30 mL via ORAL
  Filled 2018-10-21: qty 30

## 2018-10-21 MED ORDER — LACTATED RINGERS IV SOLN
INTRAVENOUS | Status: DC
  Administered 2018-10-21 – 2018-10-22 (×2): via INTRAVENOUS

## 2018-10-21 MED ORDER — OXYTOCIN BOLUS FROM INFUSION: 500.0000 mL | Freq: Once | INTRAVENOUS | Status: DC

## 2018-10-21 MED ORDER — ACETAMINOPHEN 325 MG PO TABS: 650.0000 mg | ORAL_TABLET | ORAL | Status: DC | PRN

## 2018-10-21 MED ORDER — LACTATED RINGERS IV SOLN: 500.0000 mL | INTRAVENOUS | Status: DC | PRN

## 2018-10-22 ENCOUNTER — Encounter (HOSPITAL_COMMUNITY)
Admission: AD | Disposition: A | Discharge status: Home / Self Care | Payer: Self-pay | Source: Home / Self Care | Attending: Obstetrics and Gynecology

## 2018-10-22 ENCOUNTER — Inpatient Hospital Stay (HOSPITAL_COMMUNITY): Payer: BC Managed Care – PPO

## 2018-10-22 ENCOUNTER — Inpatient Hospital Stay (HOSPITAL_COMMUNITY): Payer: BC Managed Care – PPO | Admitting: Anesthesiology

## 2018-10-22 ENCOUNTER — Encounter (HOSPITAL_COMMUNITY): Payer: Self-pay | Admitting: Obstetrics and Gynecology

## 2018-10-22 DIAGNOSIS — Z98891 History of uterine scar from previous surgery: Secondary | ICD-10-CM

## 2018-10-22 LAB — RPR: RPR Ser Ql: NONREACTIVE

## 2018-10-22 LAB — PREPARE RBC (CROSSMATCH)

## 2018-10-22 SURGERY — Surgical Case
Anesthesia: Spinal

## 2018-10-22 MED ORDER — DEXAMETHASONE SODIUM PHOSPHATE 4 MG/ML IJ SOLN
INTRAMUSCULAR | Status: AC
  Filled 2018-10-22: qty 2

## 2018-10-22 MED ORDER — MENTHOL 3 MG MT LOZG: 1.0000 | LOZENGE | OROMUCOSAL | Status: DC | PRN

## 2018-10-22 MED ORDER — LABETALOL HCL 5 MG/ML IV SOLN
40.0000 mg | Freq: Once | INTRAVENOUS | Status: AC
  Administered 2018-10-22: 40 mg via INTRAVENOUS

## 2018-10-22 MED ORDER — SIMETHICONE 80 MG PO CHEW
80.0000 mg | CHEWABLE_TABLET | Freq: Three times a day (TID) | ORAL | Status: DC
  Administered 2018-10-22 – 2018-10-27 (×13): 80 mg via ORAL
  Filled 2018-10-22 (×15): qty 1

## 2018-10-22 MED ORDER — OXYTOCIN 10 UNIT/ML IJ SOLN
INTRAMUSCULAR | Status: DC | PRN
  Administered 2018-10-22: 40 [IU] via INTRAMUSCULAR

## 2018-10-22 MED ORDER — NALBUPHINE HCL 10 MG/ML IJ SOLN: 5.0000 mg | Freq: Once | INTRAMUSCULAR | Status: DC | PRN

## 2018-10-22 MED ORDER — DIBUCAINE (PERIANAL) 1 % EX OINT: 1.0000 "application " | TOPICAL_OINTMENT | CUTANEOUS | Status: DC | PRN

## 2018-10-22 MED ORDER — SODIUM CHLORIDE 0.9% FLUSH: 3.0000 mL | INTRAVENOUS | Status: DC | PRN

## 2018-10-22 MED ORDER — LACTATED RINGERS IV SOLN
INTRAVENOUS | Status: DC
  Administered 2018-10-22 – 2018-10-23 (×2): via INTRAVENOUS

## 2018-10-22 MED ORDER — NALOXONE HCL 4 MG/10ML IJ SOLN
1.0000 ug/kg/h | INTRAVENOUS | Status: DC | PRN
  Filled 2018-10-22: qty 5

## 2018-10-22 MED ORDER — IBUPROFEN 800 MG PO TABS
800.0000 mg | ORAL_TABLET | Freq: Three times a day (TID) | ORAL | Status: AC
  Administered 2018-10-22 – 2018-10-24 (×7): 800 mg via ORAL
  Filled 2018-10-22 (×7): qty 1

## 2018-10-22 MED ORDER — MEPERIDINE HCL 25 MG/ML IJ SOLN
6.2500 mg | INTRAMUSCULAR | Status: DC | PRN
  Administered 2018-10-22: 6.25 mg via INTRAVENOUS

## 2018-10-22 MED ORDER — NALOXONE HCL 0.4 MG/ML IJ SOLN: 0.4000 mg | INTRAMUSCULAR | Status: DC | PRN

## 2018-10-22 MED ORDER — MORPHINE SULFATE (PF) 0.5 MG/ML IJ SOLN
INTRAMUSCULAR | Status: AC
  Filled 2018-10-22: qty 10

## 2018-10-22 MED ORDER — SIMETHICONE 80 MG PO CHEW
80.0000 mg | CHEWABLE_TABLET | ORAL | Status: DC
  Administered 2018-10-22 – 2018-10-27 (×6): 80 mg via ORAL
  Filled 2018-10-22 (×5): qty 1

## 2018-10-22 MED ORDER — ONDANSETRON HCL 4 MG/2ML IJ SOLN: 4.0000 mg | Freq: Three times a day (TID) | INTRAMUSCULAR | Status: DC | PRN

## 2018-10-22 MED ORDER — PHENYLEPHRINE HCL-NACL 20-0.9 MG/250ML-% IV SOLN
INTRAVENOUS | Status: DC | PRN
  Administered 2018-10-22: 60 ug/min via INTRAVENOUS

## 2018-10-22 MED ORDER — SCOPOLAMINE 1 MG/3DAYS TD PT72: 1.0000 | MEDICATED_PATCH | Freq: Once | TRANSDERMAL | Status: DC

## 2018-10-22 MED ORDER — NALBUPHINE HCL 10 MG/ML IJ SOLN: 5.0000 mg | INTRAMUSCULAR | Status: DC | PRN

## 2018-10-22 MED ORDER — WITCH HAZEL-GLYCERIN EX PADS: 1.0000 "application " | MEDICATED_PAD | CUTANEOUS | Status: DC | PRN

## 2018-10-22 MED ORDER — PRENATAL MULTIVITAMIN CH
1.0000 | ORAL_TABLET | Freq: Every day | ORAL | Status: DC
  Administered 2018-10-22 – 2018-10-27 (×6): 1 via ORAL
  Filled 2018-10-22 (×6): qty 1

## 2018-10-22 MED ORDER — MORPHINE SULFATE (PF) 0.5 MG/ML IJ SOLN
INTRAMUSCULAR | Status: DC | PRN
  Administered 2018-10-22: .15 mg via INTRATHECAL

## 2018-10-22 MED ORDER — GENTAMICIN SULFATE 40 MG/ML IJ SOLN
5.0000 mg/kg | INTRAVENOUS | Status: AC
  Administered 2018-10-22: 360 mg via INTRAVENOUS
  Filled 2018-10-22: qty 9

## 2018-10-22 MED ORDER — SENNOSIDES-DOCUSATE SODIUM 8.6-50 MG PO TABS
2.0000 | ORAL_TABLET | ORAL | Status: DC
  Administered 2018-10-23 – 2018-10-27 (×5): 2 via ORAL
  Filled 2018-10-22 (×5): qty 2

## 2018-10-22 MED ORDER — COCONUT OIL OIL: 1.0000 "application " | TOPICAL_OIL | Status: DC | PRN

## 2018-10-22 MED ORDER — ONDANSETRON HCL 4 MG/2ML IJ SOLN
INTRAMUSCULAR | Status: AC
  Filled 2018-10-22: qty 2

## 2018-10-22 MED ORDER — ONDANSETRON HCL 4 MG/2ML IJ SOLN
4.0000 mg | Freq: Four times a day (QID) | INTRAMUSCULAR | Status: AC | PRN
  Administered 2018-10-22: 4 mg via INTRAVENOUS

## 2018-10-22 MED ORDER — CLINDAMYCIN PHOSPHATE 900 MG/50ML IV SOLN
900.0000 mg | INTRAVENOUS | Status: AC
  Administered 2018-10-22: 900 mg via INTRAVENOUS
  Filled 2018-10-22: qty 50

## 2018-10-22 MED ORDER — METOCLOPRAMIDE HCL 5 MG/ML IJ SOLN: 10.0000 mg | Freq: Once | INTRAMUSCULAR | Status: DC

## 2018-10-22 MED ORDER — ACETAMINOPHEN 500 MG PO TABS
1000.0000 mg | ORAL_TABLET | Freq: Four times a day (QID) | ORAL | Status: DC
  Administered 2018-10-22 – 2018-10-25 (×13): 1000 mg via ORAL
  Filled 2018-10-22 (×13): qty 2

## 2018-10-22 MED ORDER — LABETALOL HCL 5 MG/ML IV SOLN
INTRAVENOUS | Status: AC
  Filled 2018-10-22: qty 8

## 2018-10-22 MED ORDER — KETOROLAC TROMETHAMINE 30 MG/ML IJ SOLN
30.0000 mg | Freq: Four times a day (QID) | INTRAMUSCULAR | Status: AC | PRN
  Administered 2018-10-22: 30 mg via INTRAVENOUS
  Filled 2018-10-22: qty 1

## 2018-10-22 MED ORDER — MEPERIDINE HCL 25 MG/ML IJ SOLN
INTRAMUSCULAR | Status: AC
  Filled 2018-10-22: qty 1

## 2018-10-22 MED ORDER — FENTANYL CITRATE (PF) 100 MCG/2ML IJ SOLN
INTRAMUSCULAR | Status: DC | PRN
  Administered 2018-10-22: 15 ug via INTRATHECAL

## 2018-10-22 MED ORDER — ZOLPIDEM TARTRATE 5 MG PO TABS: 5.0000 mg | ORAL_TABLET | Freq: Every evening | ORAL | Status: DC | PRN

## 2018-10-22 MED ORDER — DIPHENHYDRAMINE HCL 25 MG PO CAPS: 25.0000 mg | ORAL_CAPSULE | Freq: Four times a day (QID) | ORAL | Status: DC | PRN

## 2018-10-22 MED ORDER — BUPIVACAINE IN DEXTROSE 0.75-8.25 % IT SOLN
INTRATHECAL | Status: DC | PRN
  Administered 2018-10-22: 2 mL via INTRATHECAL

## 2018-10-22 MED ORDER — KETOROLAC TROMETHAMINE 30 MG/ML IJ SOLN: 30.0000 mg | Freq: Four times a day (QID) | INTRAMUSCULAR | Status: AC | PRN

## 2018-10-22 MED ORDER — LABETALOL HCL 200 MG PO TABS
200.0000 mg | ORAL_TABLET | Freq: Two times a day (BID) | ORAL | Status: DC
  Administered 2018-10-22 – 2018-10-23 (×4): 200 mg via ORAL
  Filled 2018-10-22 (×4): qty 1

## 2018-10-22 MED ORDER — SODIUM CHLORIDE 0.9% IV SOLUTION: Freq: Once | INTRAVENOUS | Status: DC

## 2018-10-22 MED ORDER — SERTRALINE HCL 100 MG PO TABS
100.0000 mg | ORAL_TABLET | Freq: Every day | ORAL | Status: DC
  Filled 2018-10-22 (×3): qty 1

## 2018-10-22 MED ORDER — DEXAMETHASONE SODIUM PHOSPHATE 4 MG/ML IJ SOLN
INTRAMUSCULAR | Status: DC | PRN
  Administered 2018-10-22: 4 mg via INTRAVENOUS

## 2018-10-22 MED ORDER — DIPHENHYDRAMINE HCL 25 MG PO CAPS: 25.0000 mg | ORAL_CAPSULE | ORAL | Status: DC | PRN

## 2018-10-22 MED ORDER — FENTANYL CITRATE (PF) 100 MCG/2ML IJ SOLN: 25.0000 ug | INTRAMUSCULAR | Status: DC | PRN

## 2018-10-22 MED ORDER — TETANUS-DIPHTH-ACELL PERTUSSIS 5-2.5-18.5 LF-MCG/0.5 IM SUSP
0.5000 mL | Freq: Once | INTRAMUSCULAR | Status: DC
  Filled 2018-10-22: qty 0.5

## 2018-10-22 MED ORDER — DIPHENHYDRAMINE HCL 50 MG/ML IJ SOLN: 12.5000 mg | INTRAMUSCULAR | Status: DC | PRN

## 2018-10-22 MED ORDER — LACTATED RINGERS IV SOLN
INTRAVENOUS | Status: DC | PRN
  Administered 2018-10-22 (×2): via INTRAVENOUS

## 2018-10-22 MED ORDER — FENTANYL CITRATE (PF) 100 MCG/2ML IJ SOLN
INTRAMUSCULAR | Status: AC
  Filled 2018-10-22: qty 2

## 2018-10-22 MED ORDER — OXYTOCIN 40 UNITS IN NORMAL SALINE INFUSION - SIMPLE MED: 2.5000 [IU]/h | INTRAVENOUS | Status: AC

## 2018-10-22 MED ORDER — SIMETHICONE 80 MG PO CHEW
80.0000 mg | CHEWABLE_TABLET | ORAL | Status: DC | PRN
  Administered 2018-10-26: 17:00:00 80 mg via ORAL

## 2018-10-22 MED ORDER — LACTATED RINGERS IV SOLN
INTRAVENOUS | Status: DC | PRN
  Administered 2018-10-22: 06:00:00 via INTRAVENOUS

## 2018-10-22 MED ORDER — ONDANSETRON HCL 4 MG/2ML IJ SOLN
INTRAMUSCULAR | Status: DC | PRN
  Administered 2018-10-22: 4 mg via INTRAVENOUS

## 2018-10-22 SURGICAL SUPPLY — 41 items
APL SKNCLS STERI-STRIP NONHPOA (GAUZE/BANDAGES/DRESSINGS) ×1
BENZOIN TINCTURE PRP APPL 2/3 (GAUZE/BANDAGES/DRESSINGS) ×3 IMPLANT
CHLORAPREP W/TINT 26ML (MISCELLANEOUS) ×3 IMPLANT
CLAMP CORD UMBIL (MISCELLANEOUS) IMPLANT
CLOSURE STERI-STRIP 1/2X4 (GAUZE/BANDAGES/DRESSINGS) ×1
CLOSURE WOUND 1/2 X4 (GAUZE/BANDAGES/DRESSINGS)
CLOTH BEACON ORANGE TIMEOUT ST (SAFETY) ×3 IMPLANT
CLSR STERI-STRIP ANTIMIC 1/2X4 (GAUZE/BANDAGES/DRESSINGS) ×2 IMPLANT
DRSG OPSITE POSTOP 4X10 (GAUZE/BANDAGES/DRESSINGS) ×3 IMPLANT
ELECT REM PT RETURN 9FT ADLT (ELECTROSURGICAL) ×3
ELECTRODE REM PT RTRN 9FT ADLT (ELECTROSURGICAL) ×1 IMPLANT
EXTRACTOR VACUUM KIWI (MISCELLANEOUS) IMPLANT
GLOVE BIO SURGEON STRL SZ 6.5 (GLOVE) ×2 IMPLANT
GLOVE BIO SURGEONS STRL SZ 6.5 (GLOVE) ×1
GLOVE BIOGEL PI IND STRL 7.0 (GLOVE) ×1 IMPLANT
GLOVE BIOGEL PI INDICATOR 7.0 (GLOVE) ×2
GOWN STRL REUS W/TWL LRG LVL3 (GOWN DISPOSABLE) ×6 IMPLANT
HEMOSTAT ARISTA ABSORB 3G PWDR (HEMOSTASIS) ×3 IMPLANT
KIT ABG SYR 3ML LUER SLIP (SYRINGE) IMPLANT
NEEDLE HYPO 25X5/8 SAFETYGLIDE (NEEDLE) IMPLANT
NS IRRIG 1000ML POUR BTL (IV SOLUTION) ×3 IMPLANT
PACK C SECTION WH (CUSTOM PROCEDURE TRAY) ×3 IMPLANT
PAD OB MATERNITY 4.3X12.25 (PERSONAL CARE ITEMS) ×3 IMPLANT
PENCIL SMOKE EVAC W/HOLSTER (ELECTROSURGICAL) ×3 IMPLANT
RTRCTR C-SECT PINK 25CM LRG (MISCELLANEOUS) ×3 IMPLANT
STRIP CLOSURE SKIN 1/2X4 (GAUZE/BANDAGES/DRESSINGS) IMPLANT
SUT CHROMIC 1 CTX 36 (SUTURE) ×6 IMPLANT
SUT PLAIN 0 NONE (SUTURE) IMPLANT
SUT PLAIN 2 0 XLH (SUTURE) ×3 IMPLANT
SUT VIC AB 0 CT1 27 (SUTURE) ×6
SUT VIC AB 0 CT1 27XBRD ANBCTR (SUTURE) ×2 IMPLANT
SUT VIC AB 2-0 CT1 27 (SUTURE) ×3
SUT VIC AB 2-0 CT1 TAPERPNT 27 (SUTURE) ×1 IMPLANT
SUT VIC AB 3-0 CT1 27 (SUTURE)
SUT VIC AB 3-0 CT1 TAPERPNT 27 (SUTURE) IMPLANT
SUT VIC AB 3-0 SH 27 (SUTURE) ×6
SUT VIC AB 3-0 SH 27X BRD (SUTURE) ×2 IMPLANT
SUT VIC AB 4-0 KS 27 (SUTURE) ×3 IMPLANT
TOWEL OR 17X24 6PK STRL BLUE (TOWEL DISPOSABLE) ×3 IMPLANT
TRAY FOLEY W/BAG SLVR 14FR LF (SET/KITS/TRAYS/PACK) ×3 IMPLANT
WATER STERILE IRR 1000ML POUR (IV SOLUTION) ×3 IMPLANT

## 2018-10-22 NOTE — Lactation Note (Signed)
This note was copied from a baby's chart. Lactation Consultation Note  Patient Name: Martha Mason [id number] Date: 10/22/2018 Reason for consult: Initial assessment;Infant < 6lbs;Early term 37-38.6wks   Baby in NICU.  Discussed pumping plan and encouraged mother to watch hands on pumping video.    Maternal Data Formula Feeding for Exclusion: No Has patient been taught Hand Expression?: Yes Does the patient have breastfeeding experience prior to this delivery?: Yes  Feeding Feeding Type: Breast Fed  LATCH Score Latch: Repeated attempts needed to sustain latch, nipple held in mouth throughout feeding, stimulation needed to elicit sucking reflex.  Audible Swallowing: None  Type of Nipple: Everted at rest and after stimulation  Comfort (Breast/Nipple): Soft / non-tender  Hold (Positioning): No assistance needed to correctly position infant at breast.  LATCH Score: 7  Interventions Interventions: Breast feeding basics reviewed;Assisted with latch;Skin to skin;Breast massage;Hand express;Breast compression;Adjust position;Support pillows;DEBP  Lactation Tools Discussed/Used Tools: Pump Breast pump type: Double-Electric Breast Pump WIC Program: No Pump Review: Setup, frequency, and cleaning;Milk Storage Initiated by:: MPeck Date initiated:: 10/22/18   Consult Status Consult Status: Follow-up Date: 10/23/18 Follow-up type: In-patient    Dahlia Byes [facility] 10/22/2018, 1:58 PM

## 2018-10-22 NOTE — H&P (Signed)
Martha Mason is a 43 y.o. female G2P1001 at 53 1/7 weeks (EDD 11/11/18 by known date of embryo transfer with IVF pregnancy)  presenting for IOL for preeclampsia.  Pt began to have elevated BP aat 35 weeks and a diagnosis of preeclampsia confirmed with a 24 hour urine of 6 grams of protein but otherwise labs WNL.  She has been followed carefully outpatient and had reassuring fetal testing and labs.  BP stable on labetalol 200mg  po BID. Prenatal care otherwise significant for IVF pregnancy with frozen embryo and  AMA with normal screening of embryo/Panorama low risk.  She has a h/o MVP and tachycardia. Last pregnancy she had an adherent placenta at delivery and an immediate D&C with US guidance to remove it postpartum.  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  She had the majority of tissue extracted but then required MTX to dissolve the rest.  OB History    Gravida  2   Para  1   Term  1   Preterm      AB      Living  1     SAB      TAB      Ectopic      Multiple  0   Live Births  1         12/2015 NSVD 6#6oz  Possible placenta accreta/retained tissue   Past Medical History:  Diagnosis Date  . Anxiety   . Endometrial polyp 06/30/2013  . Head injuries    Closed head injury   . Heart murmur   . Kidney stone   . Kidney stones   . Meningitis    At age 1  . Mitral valve prolapse   . MVP (mitral valve prolapse)   . 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 Preeclampsia   . Pregnancy induced hypertension   . Retained placenta after delivery without hemorrhage but with other complication 12/25/2015  . Retained products of conception, postpartum 01/16/2016  . SVD (spontaneous vaginal delivery) 12/24/2015  . Vertigo    Past Surgical History:  Procedure Laterality Date  .  DILATATION & CURETTAGE/HYSTEROSCOPY WITH TRUECLEAR N/A 06/30/2013   Procedure: DILATATION & HYSTEROSCOPY/POLYPECTOMY WITH TRUCLEAR;  Surgeon: Robley Fries, MD;  Location: [facility];  Service: Gynecology;  Laterality: N/A;  . DILATION AND CURETTAGE OF UTERUS N/A 12/24/2015   Procedure: DILATATION AND CURETTAGE;  Surgeon: Sherian Rein, MD;  Location: [facility];  Service: Obstetrics;  Laterality: N/A;  . DILATION AND EVACUATION N/A 01/16/2016   Procedure: DILATATION AND Evacuation with  ultrasound guide;  Surgeon: Sherian Rein, MD;  Location: [facility];  Service: Gynecology;  Laterality: N/A;  . NO PAST SURGERIES    . RENAL ARTERY STENT Left march 2014  . uterine polyp     Family History: family history includes Alzheimer's disease in her paternal grandfather; Heart attack in her father; Prostate cancer in her father; Stroke in her paternal grandmother. Social History:  reports that she has never smoked. She has never used smokeless tobacco. She reports that she does not drink alcohol or use drugs.     Maternal Diabetes: No Genetic Screening: Normal Maternal Ultrasounds/Referrals: Normal Fetal Ultrasounds or other Referrals:  None Maternal Substance Abuse:  No Significant Maternal Medications:  Meds include:  Other:  labetalol Significant Maternal Lab Results:  None Other Comments:  None  Review of Systems  Gastrointestinal: Negative for abdominal pain and heartburn.  Neurological: Negative for headaches.   Maternal Medical History:  Contractions: Frequency: irregular.   Perceived severity is mild.    Fetal activity: Perceived fetal activity is normal.    Prenatal complications: Pre-eclampsia.   AMA but frozen embryo transfer and panorama low risk  Prenatal Complications - Diabetes: none.    Exam by:: Briscoe Daniello Blood pressure (!) 150/71, pulse 87, temperature 98.3 F (36.8 C), temperature source Oral, resp. rate 16, height 5\' 9"  (1.753 m), weight 81.1 kg, last  menstrual period 02/07/2018, unknown if currently breastfeeding. Maternal Exam:  Uterine Assessment: Contraction strength is mild.  Contraction frequency is irregular.   Abdomen: Patient reports no abdominal tenderness. Fetal presentation: breech  Introitus: Normal vulva. Normal vagina.    Physical Exam  Constitutional: She appears well-developed.  Cardiovascular: Normal rate and regular rhythm.  Respiratory: Effort normal.  GI: Soft.  Genitourinary:    Vulva normal.   Musculoskeletal:        General: Edema present.  Neurological: She is alert.  Psychiatric: She has a normal mood and affect.  Cervix posterior and 1-2cm/frank breech confirmed with bedside US FHR category 1   Prenatal labs: ABO, Rh: --/--/O POS, O POS (05/29 2137) Antibody: NEG (05/29 2137) Rubella: Immune (11/27 0000) RPR: Nonreactive (11/27 0000)  HBsAg: Negative (11/27 0000)  HIV: Non-reactive (11/27 0000)  GBS: Negative (05/17 0000)  One hour GCT 174 3 hour GTT WNL Panorama low risk Essential panel negative  Assessment/Plan: Labs significant for creatine of 1.06, baseline 0.75, BP 151/71 D/w pt breech presentation and need to proceed with delivery with preeclampsia diagnosis Offered ECV attempt with IOL vs c-section and discussed risks/benefits of each in detail. We discussed the risk of another accreta as well, and possibility of hysterectomy in event of uncontrolled bleeding. I will Type and cross her for 2 units of RBC to have on standby.   Risks of bleeding, infection and damage to bowel and bladder d/w her as well as description of procedure. Pt ate full meal about 830pm so will proceed with c-section at 0530am.   Oliver PilaKathy W Tascha Casares 10/22/2018, 12:39 AM

## 2018-10-22 NOTE — Anesthesia Preprocedure Evaluation (Signed)
Anesthesia Evaluation  Patient identified by MRN, date of birth, ID band Patient awake    Reviewed: Allergy & Precautions, H&P , NPO status , Patient's Chart, lab work & pertinent test results  Airway Mallampati: II   Neck ROM: full    Dental   Pulmonary neg pulmonary ROS,    breath sounds clear to auscultation       Cardiovascular hypertension, + Valvular Problems/Murmurs MVP  Rhythm:regular Rate:Normal     Neuro/Psych Anxiety    GI/Hepatic   Endo/Other    Renal/GU stones     Musculoskeletal   Abdominal   Peds  Hematology   Anesthesia Other Findings   Reproductive/Obstetrics (+) Pregnancy                             Anesthesia Physical Anesthesia Plan  ASA: II  Anesthesia Plan: Spinal   Post-op Pain Management:    Induction: Intravenous  PONV Risk Score and Plan: 2 and Treatment may vary due to age or medical condition  Airway Management Planned: Simple Face Mask  Additional Equipment:   Intra-op Plan:   Post-operative Plan:   Informed Consent: I have reviewed the patients History and Physical, chart, labs and discussed the procedure including the risks, benefits and alternatives for the proposed anesthesia with the patient or authorized representative who has indicated his/her understanding and acceptance.       Plan Discussed with: CRNA, Anesthesiologist and Surgeon  Anesthesia Plan Comments:         Anesthesia Quick Evaluation

## 2018-10-22 NOTE — Consult Note (Signed)
Neonatology Note:   Attendance at C-section:    I was asked by Dr. Senaida Ores to attend this primary C/S at 37 1/7 weeks due to breech presentation and pre-eclampsia. The mother is a G2P1 O pos, GBS neg with AMA, IVF pregnancy, and pre-eclampsia, on Labetalol. ROM at delivery, fluid clear. Infant a bit slow to cry, but did so after bulb suctioning and stimulation. Delayed cord clamping was done. Needed only minimal bulb suctioning. Ap 9/9. Lungs clear to ausc in DR. Infant is able to remain with her mother for skin to skin time under nursing supervision. Transferred to the care of Pediatrician.   Doretha Sou, MD

## 2018-10-22 NOTE — Op Note (Signed)
Operative Note    Preoperative Diagnosis Term pregnancy at 37 1/7 weeks Preeclampsia Breech lie  Postoperative Diagnosis Same with likely uterine anomaly  Procedure Primary low transverse c-section with 2-layer closure (because of a narrow LUS incision was higher on fundus than typical)  Surgeon Huel Cote, MD  Anesthesia Spinal  Fluids: EBL UOP 86mL clear IVF LR  Findings The uterus was abnormal in appearance with an arcuate shape but a very poorly developed, narrow LUS.  The ovaries and tubes were present, but the left tube and ovary were more posterior and inferior in location than the right. A viable female infant  was delivered footling breech, Apgars 9,9.  Weight pending.  The placenta did have to be manually delivered but appeared to separate and no tissue was felt in either cornua of the heart shaped fundus with careful inspection.    Specimen Placenta to pathology  Procedure Note  Patient was taken to the operating room where spinal anesthesia was obtained and  found to be adequate by Allis clamp test. She was prepped and draped in the normal sterile fashion in the dorsal supine position with a leftward tilt. An appropriate time out was performed. A Pfannenstiel skin incision was then made with the scalpel and carried through to the underlying layer of fascia by sharp dissection and Bovie cautery. The fascia was nicked in the midline and the incision was extended laterally with Mayo scissors. The inferior aspect of the incision was grasped Coker clamps and dissected off the underlying rectus muscles. In a similar fashion the superior aspect was dissected off the rectus muscles. Rectus muscles were separated in the midline and the peritoneal cavity entered bluntly. The peritoneal incision was then extended both superiorly and inferiorly with careful attention to avoid both bowel and bladder. The Alexis self-retaining wound retractor was then placed  within the incision and the lower uterine segment exposed.  It was at this point it was noted that the uterus had an abnormal appearance with an extremely narrow and underdeveloped LUS.  The bladder flap was very low on this narrow segment so the uterine incision was made above it and slightly higher than usual through thicker uterine muscle. The incision was extended with bandage scissors. The infant's feet were identified and traction placed to pull the infant down to the incision reducing the arms over the chest and delivering the head in a flexed position.  The nose and mouth bulb suctioned with the cord clamped and cut as well after delayed cord clamping. The infant was handed off to the waiting NICU team.  Attempt was made to massage the uterus and get the placenta to separate but it did not fully, so to minimize bleeding, a plane was easily manually developed between the placenta and the uterine wall and the placenta gradually separated with it being slightly more adherent in the cornual regions.  The uterus cleared of all clots and debris with moist lap sponge and exteriorized for better visualization.  With careful inspection there was no obvious placental tissue felt in the cornual regions and the uterus appeared as described above.   The uterine incision was then repaired in 2 layers the first layer was a running locked layer 1-0 chromic and the second an imbricating layer of the same suture. The right angel had to be reinforced with several figure-of-eight sutures of 3-0 vicrly for hemostasis and some oozing at the serosal edge was controlled with bovie cautery and Arista.  The tubes and  ovaries were inspected and the gutters cleared of all clots and debris. The uterine incision was inspected and found to be hemostatic. All instruments and sponges as well as the Alexis retractor were then removed from the abdomen. The rectus muscles and peritoneum were then reapproximated with a running suture of 2-0  Vicryl. The fascia was then closed with 0 Vicryl in a running fashion. Subcutaneous tissue was reapproximated with 3-0 plain in a running fashion. The skin was closed with a subcuticular stitch of 4-0 Vicryl on a Keith needle and then reinforced with benzoin and Steri-Strips. At the conclusion of the procedure all instruments and sponge counts were correct. Patient was taken to the recovery room in good condition with her baby accompanying her skin to skin.

## 2018-10-22 NOTE — Anesthesia Procedure Notes (Signed)
Spinal  Patient location during procedure: OR Start time: 10/22/2018 5:17 AM End time: 10/22/2018 5:19 AM Staffing Anesthesiologist: Achille Rich, MD Performed: anesthesiologist  Preanesthetic Checklist Completed: patient identified, surgical consent, pre-op evaluation, timeout performed, IV checked, risks and benefits discussed and monitors and equipment checked Spinal Block Patient position: sitting Prep: DuraPrep Patient monitoring: cardiac monitor, continuous pulse ox and blood pressure Approach: midline Location: L3-4 Injection technique: single-shot Needle Needle type: Pencan  Needle gauge: 24 G Needle length: 9 cm Assessment Sensory level: T10 Additional Notes Functioning IV was confirmed and monitors were applied. Sterile prep and drape, including hand hygiene and sterile gloves were used. The patient was positioned and the spine was prepped. The skin was anesthetized with lidocaine.  Free flow of clear CSF was obtained prior to injecting local anesthetic into the CSF.  The spinal needle aspirated freely following injection.  The needle was carefully withdrawn.  The patient tolerated the procedure well.

## 2018-10-22 NOTE — Lactation Note (Signed)
This note was copied from a baby's chart. Lactation Consultation Note  Patient Name: Martha Mason [id number] Date: 10/22/2018 Reason for consult: Initial assessment;Infant < 6lbs;Early term 37-38.6wks  7 hours old ETI < 6 lbs who is being exclusively BF by her mother, she's a P2 but not very experienced BF. Mom was able to BF baby only for a month and experienced several BF difficulties. She reported (+) breast changes with the pregnancy but she said her first child had difficulties BF and she tried different supplementation methods at the breast such as a 26F feeding tube, and SNS and they were "too much" and she finally quit after a month when her MD put her in some meds, he advised her not to BF. Mom has a Spectra DEBP at home, and she's already familiar with hand expression. When [facility] revised hand expression with mom, she was able to get colostrum very easily out of the left breast, she struggled a little more with the other one though.   Baby already nursing when entering the room the second time (attempted to visit mom earlier but she was working with Maralyn Sago, her RN) baby was getting ready to get her labs done. Baby latching on to the right breast in cross cradle position, mom was doing a good job at doing breast compressions while nursing but she still felt "unsure" if baby was getting enough. Offered to set up a DEBP, instructions, cleaning and storage were reviewed as well as milk storage guidelines.   NP came into the room while on [facility] consultation and she examined baby again, LC got baby back to the breast once she was done. No audible swallows heard at this point though but noticed that baby was sucking consistently with long extensions of her jaw observed. Asked mom to call for assistance when needed. Mom was very engaged during [facility] consultation and had lots of questions. Discussed pumping schedule, normal newborn behavior, feeding cues, cluster feeding, milk storage guidelines and lactogenesis  II. Mom already pumping when exiting the room, praised her for her efforts.  Feeding plan:  1. Encouraged mom to keep feeding baby STS 8-12 times/24 hours or sooner if feeding cues are present 2. If baby does not cue by the 3-4 hour mark, mom will wake her up to feed 3. She'll pump every 3 hours after feedings and will offer any amount of EBM she may get  BF brochure, BF resources and feeding diary were reviewed. Mom reported all questions and concerns were answered, she's aware of LC services and will call PRN.  Maternal Data Formula Feeding for Exclusion: No Has patient been taught Hand Expression?: Yes Does the patient have breastfeeding experience prior to this delivery?: Yes  Feeding Feeding Type: Breast Fed  LATCH Score Latch: Repeated attempts needed to sustain latch, nipple held in mouth throughout feeding, stimulation needed to elicit sucking reflex.  Audible Swallowing: None  Type of Nipple: Everted at rest and after stimulation  Comfort (Breast/Nipple): Soft / non-tender  Hold (Positioning): No assistance needed to correctly position infant at breast.  LATCH Score: 7  Interventions Interventions: Breast feeding basics reviewed;Assisted with latch;Skin to skin;Breast massage;Hand express;Breast compression;Adjust position;Support pillows;DEBP  Lactation Tools Discussed/Used Tools: Pump Breast pump type: Double-Electric Breast Pump WIC Program: No Pump Review: Setup, frequency, and cleaning;Milk Storage Initiated by:: MPeck Date initiated:: 10/22/18   Consult Status Consult Status: Follow-up Date: 10/23/18 Follow-up type: In-patient    Martha Mason Martha Mason 10/22/2018, 1:19 PM

## 2018-10-22 NOTE — Transfer of Care (Signed)
Immediate Anesthesia Transfer of Care Note  Patient: Martha Mason  Procedure(s) Performed: CESAREAN SECTION (N/A )  Patient Location: PACU  Anesthesia Type:Spinal  Level of Consciousness: awake and alert   Airway & Oxygen Therapy: Patient Spontanous Breathing  Post-op Assessment: Report given to RN and Post -op Vital signs reviewed and stable  Post vital signs: Reviewed  Last Vitals:  Vitals Value Taken Time  BP 140/85 10/22/2018  6:52 AM  Temp    Pulse 81 10/22/2018  6:57 AM  Resp 25 10/22/2018  6:57 AM  SpO2 99 % 10/22/2018  6:57 AM  Vitals shown include unvalidated device data.  Last Pain:  Vitals:   10/22/18 0429  TempSrc: Oral         Complications: No apparent anesthesia complications

## 2018-10-23 ENCOUNTER — Encounter (HOSPITAL_COMMUNITY): Payer: Self-pay | Admitting: *Deleted

## 2018-10-23 LAB — CBC
HCT: 30.9 % — ABNORMAL LOW (ref 36.0–46.0)
Hemoglobin: 10.5 g/dL — ABNORMAL LOW (ref 12.0–15.0)
MCH: 33.7 pg (ref 26.0–34.0)
MCHC: 34 g/dL (ref 30.0–36.0)
MCV: 99 fL (ref 80.0–100.0)
Platelets: 210 10*3/uL (ref 150–400)
RBC: 3.12 MIL/uL — ABNORMAL LOW (ref 3.87–5.11)
RDW: 13.5 % (ref 11.5–15.5)
WBC: 10.9 10*3/uL — ABNORMAL HIGH (ref 4.0–10.5)
nRBC: 0 % (ref 0.0–0.2)

## 2018-10-23 LAB — COMPREHENSIVE METABOLIC PANEL
ALT: 21 U/L (ref 0–44)
AST: 35 U/L (ref 15–41)
Albumin: 1.5 g/dL — ABNORMAL LOW (ref 3.5–5.0)
Alkaline Phosphatase: 93 U/L (ref 38–126)
Anion gap: 7 (ref 5–15)
BUN: 24 mg/dL — ABNORMAL HIGH (ref 6–20)
CO2: 23 mmol/L (ref 22–32)
Calcium: 8.3 mg/dL — ABNORMAL LOW (ref 8.9–10.3)
Chloride: 105 mmol/L (ref 98–111)
Creatinine, Ser: 1.15 mg/dL — ABNORMAL HIGH (ref 0.44–1.00)
GFR calc Af Amer: >60 mL/min (ref >60)
GFR calc non Af Amer: 57 mL/min — ABNORMAL LOW (ref >60)
Glucose, Bld: 92 mg/dL (ref 70–99)
Potassium: 4.4 mmol/L (ref 3.5–5.1)
Sodium: 135 mmol/L (ref 135–145)
Total Bilirubin: 0.4 mg/dL (ref 0.3–1.2)
Total Protein: 4 g/dL — ABNORMAL LOW (ref 6.5–8.1)

## 2018-10-23 MED ORDER — PANTOPRAZOLE SODIUM 40 MG PO TBEC
40.0000 mg | DELAYED_RELEASE_TABLET | Freq: Every day | ORAL | Status: DC
  Administered 2018-10-24 – 2018-10-27 (×4): 40 mg via ORAL
  Filled 2018-10-23 (×4): qty 1

## 2018-10-23 MED ORDER — OXYCODONE HCL 5 MG PO TABS
5.0000 mg | ORAL_TABLET | ORAL | Status: DC | PRN
  Administered 2018-10-23 – 2018-10-24 (×2): 5 mg via ORAL
  Filled 2018-10-23 (×2): qty 1

## 2018-10-23 NOTE — Progress Notes (Signed)
Subjective: Postpartum Day 1 Cesarean Delivery--breech, preeclampsia Patient reports tolerating PO and no problems voiding.  Rounded on patient in NICU because baby admitted this AM with low temperature and low blood sugar, baby doing fine now--nursing.  No HA or PIH sx  Objective: Vital signs in last 24 hours: Temp:  [97.8 F (36.6 C)-98.1 F (36.7 C)] 97.8 F (36.6 C) (05/31 0412) Pulse Rate:  [60-67] 60 (05/31 0412) Resp:  [18-19] 18 (05/31 0412) BP: (132-159)/(73-85) 136/78 (05/31 0412) SpO2:  [96 %-97 %] 97 % (05/31 0412)  Physical Exam:  General: alert and cooperative Lochia: appropriate Uterine Fundus: firm Incision: C/D/I   Recent Labs    10/21/18 2140 10/23/18 0438  HGB 12.3 10.5*  HCT 36.1 30.9*    Assessment/Plan: Status post Cesarean section. Doing well postoperatively. BP controlled on 200mg  labetalol BID Creatinine bumped slightly to 1.15 but beginning to diurese.  All other labs WNL  Will recheck tomorrow to be sure trending back down  Oliver Pila 10/23/2018, 11:17 AM

## 2018-10-23 NOTE — Progress Notes (Signed)
Spoke with Dr. Senaida Ores about patient having pain 8 out of 10. Patients states her allergy to oxycodone "feels like bugs are crawling all over me and it makes me very tired". Dr. Senaida Ores aware of what patient states. Dr. Senaida Ores gave patient choice to take tramadol or oxycodone, patient chooses to take oxycodone. Will call Dr. Senaida Ores if this medicine doesn't provide relief.

## 2018-10-23 NOTE — Anesthesia Postprocedure Evaluation (Signed)
Anesthesia Post Note  Patient: Martha Mason  Procedure(s) Performed: CESAREAN SECTION (N/A )     Patient location during evaluation: PACU Anesthesia Type: Spinal Level of consciousness: oriented and awake and alert Pain management: pain level controlled Vital Signs Assessment: post-procedure vital signs reviewed and stable Respiratory status: spontaneous breathing, respiratory function stable and patient connected to nasal cannula oxygen Cardiovascular status: blood pressure returned to baseline and stable Postop Assessment: no headache, no backache and no apparent nausea or vomiting Anesthetic complications: no    Last Vitals:  Vitals:   10/23/18 0008 10/23/18 0412  BP: 132/76 136/78  Pulse: 65 60  Resp: 18 18  Temp: 36.7 C 36.6 C  SpO2: 97% 97%    Last Pain:  Vitals:   10/23/18 0608  TempSrc:   PainSc: 0-No pain   Pain Goal:                   Siddhartha Hoback S

## 2018-10-24 LAB — COMPREHENSIVE METABOLIC PANEL
ALT: 21 U/L (ref 0–44)
AST: 31 U/L (ref 15–41)
Albumin: 1.6 g/dL — ABNORMAL LOW (ref 3.5–5.0)
Alkaline Phosphatase: 87 U/L (ref 38–126)
Anion gap: 7 (ref 5–15)
BUN: 21 mg/dL — ABNORMAL HIGH (ref 6–20)
CO2: 21 mmol/L — ABNORMAL LOW (ref 22–32)
Calcium: 7.9 mg/dL — ABNORMAL LOW (ref 8.9–10.3)
Chloride: 111 mmol/L (ref 98–111)
Creatinine, Ser: 1.03 mg/dL — ABNORMAL HIGH (ref 0.44–1.00)
GFR calc Af Amer: >60 mL/min (ref >60)
GFR calc non Af Amer: >60 mL/min (ref >60)
Glucose, Bld: 82 mg/dL (ref 70–99)
Potassium: 4 mmol/L (ref 3.5–5.1)
Sodium: 139 mmol/L (ref 135–145)
Total Bilirubin: 0.3 mg/dL (ref 0.3–1.2)
Total Protein: 3.9 g/dL — ABNORMAL LOW (ref 6.5–8.1)

## 2018-10-24 LAB — CBC
HCT: 31.2 % — ABNORMAL LOW (ref 36.0–46.0)
Hemoglobin: 10.3 g/dL — ABNORMAL LOW (ref 12.0–15.0)
MCH: 33.2 pg (ref 26.0–34.0)
MCHC: 33 g/dL (ref 30.0–36.0)
MCV: 100.6 fL — ABNORMAL HIGH (ref 80.0–100.0)
Platelets: 167 10*3/uL (ref 150–400)
RBC: 3.1 MIL/uL — ABNORMAL LOW (ref 3.87–5.11)
RDW: 14 % (ref 11.5–15.5)
WBC: 8.9 10*3/uL (ref 4.0–10.5)
nRBC: 0 % (ref 0.0–0.2)

## 2018-10-24 MED ORDER — NIFEDIPINE ER OSMOTIC RELEASE 30 MG PO TB24
30.0000 mg | ORAL_TABLET | Freq: Every day | ORAL | Status: DC
  Administered 2018-10-24 – 2018-10-25 (×2): 30 mg via ORAL
  Filled 2018-10-24 (×2): qty 1

## 2018-10-24 NOTE — Lactation Note (Signed)
This note was copied from a baby's chart. Lactation Consultation Note  Patient Name: Martha Mason Date: 10/24/2018 Reason for consult: Follow-up assessment;Mother's request;Early term 37-38.6wks;1st time breastfeeding;NICU baby;Infant < 6lbs  P2 mother whose infant is now 43 hours old.  This is an ETI at 37+1 weeks, weighing < 6 lbs and in the NICU.  Mother breast fed her first child for one month.  Mother had infant latched in the cradle hold on the left breast when I entered.  Baby was sucking in short shallow bursts.  Offered to assist and mother accepted.  Suggested allowing baby to obtain a deeper latch and to hold in the cross cradle position.  Mother agreeable.  With the deeper latch baby had flanged lips, rhythmic sucking and better jaw movement.  Mother denied pain with latching.  A few audible swallows noted.  Gentle stimulation provided to keep baby awake and interested in sucking at the breast.  Demonstrated breast compressions during feeding.  Observed baby feeding for 12 minutes before she released and mother burped.  Baby fell asleep STS on mother's chest.  Encouraged mother to pump immediately after breast feeding.  Explained how she can use her EBM to feed prior to the next latch to try to awaken baby for feeding.  Encouraged hand expression before/after feeding to help increase supply.  Discussed pumping at baby's bedside in NICU.    Mother had many breast feeding/pumping questions which I answered to her satisfaction.  Discussed pumping.  Provided "Providing Breast Milk For Your Baby in the NICU."  Showed her the milk storage chart in handout.  Mother will call for latch assistance as needed.  RN updated.    Maternal Data Formula Feeding for Exclusion: No Has patient been taught Hand Expression?: Yes Does the patient have breastfeeding experience prior to this delivery?: No  Feeding Feeding Type: Breast Fed Nipple Type: Nfant Extra Slow Flow (gold)  LATCH  Score Latch: Grasps breast easily, tongue down, lips flanged, rhythmical sucking.  Audible Swallowing: A few with stimulation  Type of Nipple: Everted at rest and after stimulation  Comfort (Breast/Nipple): Soft / non-tender  Hold (Positioning): Assistance needed to correctly position infant at breast and maintain latch.  LATCH Score: 8  Interventions Interventions: Breast feeding basics reviewed;Assisted with latch;Breast massage;Breast compression;Position options;Support pillows;Adjust position;DEBP  Lactation Tools Discussed/Used     Consult Status Consult Status: PRN Date: 10/24/18 Follow-up type: Call as needed    Martha Mason 10/24/2018, 9:35 AM

## 2018-10-24 NOTE — Progress Notes (Signed)
POD #2 LTCS, preeclampsia, breech Doing ok, feeling better Afeb, VSS, BP 130-150/60-80 Cre down to 1.03, remaining labs stable Continue routine care.  Will change Labetalol to procardia for daily dose and better medication, see how BP responds

## 2018-10-24 NOTE — Progress Notes (Signed)
Initial visit with Martha Mason in her room.  Pt was somewhat tearful as she shared about her delivery.  She shared how sudden it felt when the doctor called her to say she needed to come in for an induction and that [name] has been moving quite a lot and her disappointment in finding out that she was not in the position to delivery vaginally.  [name] is coping fairly well with her c-section pain, but worried about baby [name].  She shared that she just wants to be holding her, but that she's having trouble regulating her temperature and blood sugar.  As she told me that, she grew tearful and stated, "I just want somebody to say, she's not going to die."  [name] generally recognizes this is not a concern and acknowledge the neonatologist said she's doing okay and isn't in danger, but still acknowledges she's specifically longing to hear those words.  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  At the end of our visit, she was laughing and generally reported feeling better.  She is aware of how to reach spiritual care for further support.  Please page as further needs arise.  [name]. Carley Hammed, M.Div. [facility] Chaplain Pager [phone] Office [phone]        10/24/18 1300  Clinical Encounter Type  Visited With Patient  Visit Type Initial;Spiritual support;Social support  Spiritual Encounters  Spiritual Needs Emotional  Stress Factors  Patient Stress Factors Health changes;Loss of control;Major life changes

## 2018-10-25 ENCOUNTER — Inpatient Hospital Stay (HOSPITAL_COMMUNITY): Payer: BC Managed Care – PPO

## 2018-10-25 LAB — TYPE AND SCREEN
ABO/RH(D): O POS
Antibody Screen: NEGATIVE
Unit division: 0
Unit division: 0

## 2018-10-25 LAB — BPAM RBC
Blood Product Expiration Date: 202006232359
Blood Product Expiration Date: 202006232359
ISSUE DATE / TIME: 202005251857
ISSUE DATE / TIME: 202005251857
Unit Type and Rh: 5100
Unit Type and Rh: 5100

## 2018-10-25 MED ORDER — IBUPROFEN 800 MG PO TABS
800.0000 mg | ORAL_TABLET | Freq: Three times a day (TID) | ORAL | Status: DC
  Administered 2018-10-25 – 2018-10-27 (×5): 800 mg via ORAL
  Filled 2018-10-25 (×5): qty 1

## 2018-10-25 MED ORDER — NIFEDIPINE ER OSMOTIC RELEASE 30 MG PO TB24
30.0000 mg | ORAL_TABLET | Freq: Every day | ORAL | Status: DC
  Administered 2018-10-25: 30 mg via ORAL
  Filled 2018-10-25: qty 1

## 2018-10-25 MED ORDER — IBUPROFEN 800 MG PO TABS
800.0000 mg | ORAL_TABLET | Freq: Once | ORAL | Status: AC
  Administered 2018-10-25: 800 mg via ORAL
  Filled 2018-10-25: qty 1

## 2018-10-25 MED ORDER — ACETAMINOPHEN 325 MG PO TABS
650.0000 mg | ORAL_TABLET | Freq: Four times a day (QID) | ORAL | Status: DC
  Administered 2018-10-25 – 2018-10-27 (×7): 650 mg via ORAL
  Filled 2018-10-25 (×7): qty 2

## 2018-10-25 NOTE — Lactation Note (Signed)
This note was copied from a baby's chart. Lactation Consultation Note  Patient Name: Martha Mason BTDVV'O Date: 10/25/2018  [name] is 43 hours old in the NICU.  Mom is pumping every 3 hours and obtaining 30 mls.  Baby is receiving NG and bottle feeds.  Mom states she has not been putting baby to breast recently because the NICU nurse discouraged it. Encouraged mom to discuss with RN today about the possibility of breastfeeding during NG feed.  Instructed to call for [facility] assist.  Mom has many questions about pumping and transporting milk after discharge.  Reviewed and answered questions. Recommended mom request LC assist today.   Maternal Data    Feeding Feeding Type: Formula Nipple Type: Nfant Extra Slow Flow (gold)  LATCH Score                   Interventions    Lactation Tools Discussed/Used     Consult Status      [name] 10/25/2018, 11:23 AM

## 2018-10-25 NOTE — Lactation Note (Signed)
This note was copied from a baby's chart. Lactation Consultation Note  Patient Name: Martha Mason JSRPR'X Date: 10/25/2018 Reason for consult: Follow-up assessment;NICU baby Called to the NICU for feeding assist.  Baby awake and showing feeding cues.  Mom positioned baby in cradle hold.  Baby latched easily.  Gentle chin tug brought bottom lip out and widened latch some.  Baby fed well with minimal stimulation needed.  Mom pumped 50 mls 3 hours ago..  Instructed to continue to post pump.  Encouraged to call for assist/concerns.  Questions answered.  Maternal Data    Feeding Feeding Type: Breast Fed Nipple Type: Nfant Extra Slow Flow (gold)  LATCH Score Latch: Grasps breast easily, tongue down, lips flanged, rhythmical sucking.  Audible Swallowing: A few with stimulation  Type of Nipple: Everted at rest and after stimulation  Comfort (Breast/Nipple): Soft / non-tender  Hold (Positioning): No assistance needed to correctly position infant at breast.  LATCH Score: 9  Interventions    Lactation Tools Discussed/Used     Consult Status Consult Status: Follow-up Date: 10/26/18 Follow-up type: In-patient    [name] 10/25/2018, 2:40 PM

## 2018-10-25 NOTE — Progress Notes (Signed)
Patient ID: Martha Mason, female   DOB: May 07, 1976, 43 y.o.   MRN: [id number] Late entry Pt reports doing better emotionally today. She just came back from NICU. She reports tolerable incision pain, scant lochia. She reports some chest/right shoulder pain. Ambulating and voiding well. Denies HA or blurry vision or SOB.  VS - 153-155/74-82 GEN - NAD, AAOx3 ABD - incision c/d/i, no drainage EXT - no homans  A/P: POD#3 s/p pltc/s - stable         BP still labile. Will continue to monitor and adjust medication appropriately - due for her procardia at 10am              Once BP stable possible discharge to home tomorrow

## 2018-10-25 NOTE — Progress Notes (Signed)
Patient ID: Martha Mason, female   DOB: 09/14/1975, 43 y.o.   MRN: [id number] Pt teary and frustrated. She is anxious about possible discharge to home tomorrow without a clear plan of expectation for the criteria for baby to be discharged to home from NICU.  She reports chest pain has improved. Pain well controlled VS - 158/91  CXR- wnl  A/P: I spoke to Neonatologist and explained pts concerns/frustrations. She plans to come to pts room to review care of baby with pt and husband tonight           Will monitor BP overnight ; now on BID dosing of procardia 30xl

## 2018-10-25 NOTE — Progress Notes (Signed)
Patient resting in bed after return form NICU. Patient states she is having "chest discomfort that she thinks is gas pain". She reports burping and passing a small amount of gas. Patient denies shortness of breath, lungs clear and patient talkative. Stayed with patient and had a casual conversation for 15 minutes. Requested that patient call if she has any additional chest pain, shortness of breath, increased pain in any location or any further concerns.

## 2018-10-25 NOTE — Progress Notes (Addendum)
Patient ID: Martha Mason, female   DOB: 1976-03-10, 43 y.o.   MRN: [id number]  Pt reports dull achy pain in right side of chest that has been present since yesterday and radiates into her back. No SOB . She di get some relief with protonix and has been burping She denies HA or blurry vision VS -166/83 GEN - NAD  A/P: G2P2002 on POD#3 s/p c/s for malpresentation         - PreE - BP not well controlled. Will increase procardia to 30mg  bid - dose to be given now         - Chst pain - will obtain cxr now         - med review - decrease tylenol; add motrin         - vitals q 4hrs

## 2018-10-25 NOTE — Progress Notes (Signed)
Patient visiting baby in NICU.

## 2018-10-25 NOTE — Progress Notes (Signed)
Patient resting in bed, napping. Awakened when RN entered room. Asked patient about how her chest was feeling. She reports feeling better. Describes the discomfort being located on right side of sternum, inner aspect of breast radiating to back, under shoulder blade. Dr. Mindi Slicker informed. MD will place order for chest xray and will increase dose of BP medication due to elevated BP.

## 2018-10-26 ENCOUNTER — Encounter (HOSPITAL_COMMUNITY): Payer: Self-pay | Admitting: Obstetrics and Gynecology

## 2018-10-26 LAB — COMPREHENSIVE METABOLIC PANEL
ALT: 38 U/L (ref 0–44)
AST: 44 U/L — ABNORMAL HIGH (ref 15–41)
Albumin: 1.8 g/dL — ABNORMAL LOW (ref 3.5–5.0)
Alkaline Phosphatase: 89 U/L (ref 38–126)
Anion gap: 11 (ref 5–15)
BUN: 12 mg/dL (ref 6–20)
CO2: 24 mmol/L (ref 22–32)
Calcium: 8.1 mg/dL — ABNORMAL LOW (ref 8.9–10.3)
Chloride: 106 mmol/L (ref 98–111)
Creatinine, Ser: 0.83 mg/dL (ref 0.44–1.00)
GFR calc Af Amer: >60 mL/min (ref >60)
GFR calc non Af Amer: >60 mL/min (ref >60)
Glucose, Bld: 86 mg/dL (ref 70–99)
Potassium: 3.5 mmol/L (ref 3.5–5.1)
Sodium: 141 mmol/L (ref 135–145)
Total Bilirubin: 0.4 mg/dL (ref 0.3–1.2)
Total Protein: 4.9 g/dL — ABNORMAL LOW (ref 6.5–8.1)

## 2018-10-26 LAB — CBC
HCT: 36.1 % (ref 36.0–46.0)
Hemoglobin: 12.2 g/dL (ref 12.0–15.0)
MCH: 32.8 pg (ref 26.0–34.0)
MCHC: 33.8 g/dL (ref 30.0–36.0)
MCV: 97 fL (ref 80.0–100.0)
Platelets: 233 10*3/uL (ref 150–400)
RBC: 3.72 MIL/uL — ABNORMAL LOW (ref 3.87–5.11)
RDW: 13.1 % (ref 11.5–15.5)
WBC: 6.5 10*3/uL (ref 4.0–10.5)
nRBC: 0 % (ref 0.0–0.2)

## 2018-10-26 MED ORDER — NIFEDIPINE ER OSMOTIC RELEASE 30 MG PO TB24
60.0000 mg | ORAL_TABLET | ORAL | Status: AC
  Filled 2018-10-26: qty 2

## 2018-10-26 MED ORDER — LABETALOL HCL 200 MG PO TABS
200.0000 mg | ORAL_TABLET | Freq: Two times a day (BID) | ORAL | Status: DC
  Administered 2018-10-26 – 2018-10-27 (×2): 200 mg via ORAL
  Filled 2018-10-26 (×3): qty 1

## 2018-10-26 MED ORDER — NIFEDIPINE ER OSMOTIC RELEASE 30 MG PO TB24
30.0000 mg | ORAL_TABLET | Freq: Once | ORAL | Status: AC
  Administered 2018-10-26: 11:00:00 30 mg via ORAL

## 2018-10-26 MED ORDER — NIFEDIPINE ER OSMOTIC RELEASE 30 MG PO TB24
90.0000 mg | ORAL_TABLET | Freq: Every day | ORAL | Status: DC
  Administered 2018-10-27: 90 mg via ORAL
  Filled 2018-10-26: qty 3

## 2018-10-26 MED ORDER — NIFEDIPINE ER OSMOTIC RELEASE 30 MG PO TB24
60.0000 mg | ORAL_TABLET | Freq: Every day | ORAL | Status: AC
  Administered 2018-10-26: 60 mg via ORAL
  Filled 2018-10-26: qty 2

## 2018-10-26 NOTE — Progress Notes (Signed)
Subjective: Postpartum Day 4: Cesarean Delivery/preeclampsia Patient reports tolerating PO and no problems voiding.  No HA or PIH sx.  A little tearful discussing logistics of having 43 y/o at home and being here for extended stay.  Objective: Vital signs in last 24 hours: Temp:  [98 F (36.7 C)-98.6 F (37 C)] 98.6 F (37 C) (06/03 1010) Pulse Rate:  [72-101] 101 (06/03 1010) Resp:  [16-18] 16 (06/03 1010) BP: (157-166)/(80-93) 157/89 (06/03 1010) SpO2:  [98 %] 98 % (06/03 0208)  Physical Exam:  General: alert and cooperative Lochia: appropriate Uterine Fundus: firm Incision: C/D/I   Recent Labs    10/24/18 0503 10/26/18 0721  HGB 10.3* 12.2  HCT 31.2* 36.1    Assessment/Plan: Status post Cesarean section. Postoperative course complicated by continued elevated BP.   Labs stable, creatinine down to 0.83 LFT's slightly higher at 44/38. Procardia increased to 60mg  this AM when BP 160/80-90.  Will continue to see how responds and adjust accordingly.  D/w pt I do not want to d/c her until BP well-controlled.  Will see how respond to increased dosing.   Oliver Pila 10/26/2018, 10:21 AM

## 2018-10-26 NOTE — Progress Notes (Signed)
CLINICAL SOCIAL WORK MATERNAL/CHILD NOTE  Patient Details  Name: Martha Mason MRN: [id number] Date of Birth: 10/22/2020  Date:  10/26/2018  Clinical Social Worker Initiating Note:  Abundio Miu,      Date/Time: Initiated:  10/26/18/1355             Child's Name:  Martha Mason   Biological Parents:  Mother, Father(Father: [name])   Need for Interpreter:  None   Reason for Referral:  Behavioral Health Concerns, Other (Comment)(NICU Admission)   Address:  [place] [address]    Phone number:  [phone] (home)     Additional phone number:   Household Members/Support Persons (HM/SP):   Household Member/Support Person 1, Household Member/Support Person 2   HM/SP Name Relationship DOB or Age  HM/SP -Maddock FOB/Husband   HM/SP -2 Bora Bost son 12/24/2015  HM/SP -3     HM/SP -4     HM/SP -5     HM/SP -6     HM/SP -7     HM/SP -8       Natural Supports (not living in the home): Immediate Family(FOB's dad and FOB's dad's girlfriend)   Professional Supports:None   Employment:Full-time   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   Education:  Production designer, theatre/television/film   Homebound arranged:    Printmaker   Other Resources:     Cultural/Religious Considerations Which May Impact Care:   Strengths: Ability to meet basic needs , Home prepared for child , Pediatrician chosen   Psychotropic Medications:         Pediatrician:    [place]  Pediatrician List:   [facility]  [place]     Pediatrician Fax Number:    Risk Factors/Current Problems: None   Cognitive State: Able to Concentrate , Alert , Linear Thinking , Insightful , Goal Oriented    Mood/Affect: Interested , Relaxed , Calm    CSW Assessment:CSW met with [name] at bedside to  discuss infant's NICU admission and behavioral health concerns. CSW introduced self and explained reason for consult. [name] was welcoming and engaged during assessment. [name] reported that she resides with her husband, son and dog. 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 [name] reported that she has all items needed to care for infant. CSW inquired about [name]'s support system, [name] reported that her husband's dad and husband's dad's girlfriend are her supports and assisting with son in the home while she is hospitalized.   CSW inquired about [name]'s mental health history, [name] denied any mental health history. Per chart review, [name] has a history of anxiety and depression, [name] did not endorse these diagnoses. [name] presented calm and did not demonstrate any acute mental health signs/symptoms. CSW assessed for safety, [name] denied SI, HI and domestic violence.   CSW provided education regarding the baby blues period vs. perinatal mood disorders, discussed treatment and gave resources for mental health follow up if concerns arise.  CSW recommends self-evaluation during the postpartum time period using the New Mom Checklist from Postpartum Progress and encouraged [name] to contact a medical professional if symptoms are noted at any time.    CSW and [name] discussed infant's NICU admission. [name] reported that today is the first day she felt well informed about infant's care in the NICU. CSW apologized and informed [name] about options to  speak with staff to get updates. CSW informed [name] about the NICU, what to expect and resources/supports available while infant is admitted to the NICU. [name] denied any current questions/concerns. CSW provided [name] with a butterfly from [employer].   CSW will continue to offer resources/supports while infant is admitted to the NICU.   CSW Plan/Description: Perinatal Mood and Anxiety Disorder (PMADs) Education, Other Patient/Family Education    Burnis Medin, LCSW 10/26/2018, 2:00 PM

## 2018-10-26 NOTE — Progress Notes (Signed)
Reporting RN adm pt's procardia. Labetalol 200mg  is not due at this time so reporting RN did not administered meds. Labetalol is scheduled for tomorrow at 0800.

## 2018-10-26 NOTE — Progress Notes (Signed)
Dr. Senaida Ores updated on patient's current BP; new order received for Labetalol BID; will continue to monitor.

## 2018-10-26 NOTE — Progress Notes (Signed)
Dr. Senaida Ores notified of missed Procardia dose this am. Order received to give 60mg  procardia 24hr times 1 now.

## 2018-10-26 NOTE — Progress Notes (Signed)
Patient ID: Martha Mason, female   DOB: 1976-05-10, 44 y.o.   MRN: [id number] Events of day summarized:  Pt BP elevated this AM  150-170/80-94 And when reported to me I gave patient what I thought was an additional 30mg  of Procardia XL to go with her ordered 60mg  from 0715.  When her BP continued to be elevated, the afternoon RN was investigating and realized she never received her AM Procardia because was to be given around shift change and PM nurse thought the AM nurse was giving and vice versa.  So, pt received an additional dose of po labetalol midday at 1430 and her procardia XL 60 mg was given at 1900.   It is hard to know if her elevated BP is only from the lack of AM meds or not.  S/p the 60mg  3 hours ago her BP is 147/87 so I am going to increase her AM Procardia to 90mg  at 1000pm.  She is also scheduled for labetalol 200mg  po BID beginning tomorrow AM at 0800.   Hopefully now that we are back on track we can get an idea if this regimen will control her BP.  I am going to recheck her labs  in AM since LFT's had bumped slightly this AM

## 2018-10-26 NOTE — Addendum Note (Signed)
Addendum  created 10/26/18 3734 by Achille Rich, MD   Intraprocedure Event deleted

## 2018-10-26 NOTE — Progress Notes (Signed)
Updated Dr. Senaida Ores of patient's current bp 157/89;  New orders received to give Procardia 30 mg XL now and then change daily dose from 60 to 90 for tomorrow.  Will continue monitoring.

## 2018-10-27 ENCOUNTER — Encounter (HOSPITAL_COMMUNITY): Payer: Self-pay | Admitting: *Deleted

## 2018-10-27 LAB — CBC
HCT: 37.6 % (ref 36.0–46.0)
Hemoglobin: 12.7 g/dL (ref 12.0–15.0)
MCH: 33 pg (ref 26.0–34.0)
MCHC: 33.8 g/dL (ref 30.0–36.0)
MCV: 97.7 fL (ref 80.0–100.0)
Platelets: 262 10*3/uL (ref 150–400)
RBC: 3.85 MIL/uL — ABNORMAL LOW (ref 3.87–5.11)
RDW: 13.2 % (ref 11.5–15.5)
WBC: 7.3 10*3/uL (ref 4.0–10.5)
nRBC: 0 % (ref 0.0–0.2)

## 2018-10-27 LAB — COMPREHENSIVE METABOLIC PANEL
ALT: 36 U/L (ref 0–44)
AST: 34 U/L (ref 15–41)
Albumin: 2 g/dL — ABNORMAL LOW (ref 3.5–5.0)
Alkaline Phosphatase: 96 U/L (ref 38–126)
Anion gap: 10 (ref 5–15)
BUN: 13 mg/dL (ref 6–20)
CO2: 24 mmol/L (ref 22–32)
Calcium: 8 mg/dL — ABNORMAL LOW (ref 8.9–10.3)
Chloride: 107 mmol/L (ref 98–111)
Creatinine, Ser: 0.91 mg/dL (ref 0.44–1.00)
GFR calc Af Amer: >60 mL/min (ref >60)
GFR calc non Af Amer: >60 mL/min (ref >60)
Glucose, Bld: 95 mg/dL (ref 70–99)
Potassium: 3.8 mmol/L (ref 3.5–5.1)
Sodium: 141 mmol/L (ref 135–145)
Total Bilirubin: 0.2 mg/dL — ABNORMAL LOW (ref 0.3–1.2)
Total Protein: 4.7 g/dL — ABNORMAL LOW (ref 6.5–8.1)

## 2018-10-27 MED ORDER — LABETALOL HCL 200 MG PO TABS: 200.0000 mg | ORAL_TABLET | Freq: Two times a day (BID) | ORAL | 2 refills | Status: DC

## 2018-10-27 MED ORDER — PRENATAL MULTIVITAMIN CH: 1.0000 | ORAL_TABLET | Freq: Every day | ORAL | 3 refills | Status: DC

## 2018-10-27 MED ORDER — OXYCODONE HCL 5 MG PO TABS: 5.0000 mg | ORAL_TABLET | Freq: Four times a day (QID) | ORAL | 0 refills | Status: DC | PRN

## 2018-10-27 MED ORDER — IBUPROFEN 800 MG PO TABS: 800.0000 mg | ORAL_TABLET | Freq: Three times a day (TID) | ORAL | 1 refills | Status: DC | PRN

## 2018-10-27 MED ORDER — NIFEDIPINE ER OSMOTIC RELEASE 90 MG PO TB24: 90.0000 mg | ORAL_TABLET | Freq: Every day | ORAL | 2 refills | Status: DC

## 2018-10-27 NOTE — Progress Notes (Signed)
Pt's bp still elevated after pt having received what was thought to be an additional 30 mg of Procardia to the original 60 mg of Procardia. This RN understood in report from night shift RN, in the patient's room, that she received the 60 mg of Procardia at the end of night shift.  Upon investigation, this RN discovered that the 60 mg needed to be given on day shift.  Miscommunication reported to Dr. Senaida Ores; Pt had already received an afternoon dose of 200 mg of Labetalol to bring bp down so no new orders yet; only to continue monitoring bp closely.  Report given in detail to on-coming RN, Corrie Dandy.

## 2018-10-27 NOTE — Progress Notes (Signed)
Subjective: Postpartum Day 4: Cesarean Delivery Patient reports incisional pain, tolerating PO and no problems voiding.    Objective: Vital signs in last 24 hours: Temp:  [97.8 F (36.6 C)-98.6 F (37 C)] 97.8 F (36.6 C) (06/04 0531) Pulse Rate:  [84-101] 84 (06/04 0531) Resp:  [16-18] 18 (06/04 0531) BP: (147-174)/(83-94) 151/83 (06/04 0531)  Physical Exam:  General: alert and no distress Lochia: appropriate Uterine Fundus: firm Incision: healing well DVT Evaluation: No evidence of DVT seen on physical exam.  Recent Labs    10/26/18 0721 10/27/18 0507  HGB 12.2 12.7  HCT 36.1 37.6    Assessment/Plan: Status post Cesarean section. Doing well postoperatively.  Discharge home with standard precautions and return to clinic in 1.2 and 6 weeks.  D/c with procardia 90 and labetalol 200 bid.    Corday Wyka Bovard-Stuckert 10/27/2018, 7:41 AM

## 2018-10-27 NOTE — Progress Notes (Signed)
Patient concerned about going home today. States she feels "shakey" and is concerned about her blood pressures. BP this am was 168/88. Pt received Labetalol 200mg . BP 2 hours later= 138/85. Pt seemed relieved that BP was down but, wanted me to cal MD to express her concerns. Dr. Ellyn Hack was called and she stated that Pt could stay until later this afternoon and for repeat BP to be taken. We will re evaluate how Pt feels today.

## 2018-10-27 NOTE — Discharge Summary (Signed)
OB Discharge Summary     Patient Name: Concepcion LivingJennifer W Reckart DOB: March 03, 1976 MRN: [id number]  Date of admission: 10/21/2018 Delivering MD: Huel CoteICHARDSON, KATHY   Date of discharge: 10/27/2018  Admitting diagnosis: pregnancy Intrauterine pregnancy: 66105w6d     Secondary diagnosis:  Active Problems:   Preeclampsia, third trimester   S/P primary low transverse C-section  Additional problems: Breech presentation     Discharge diagnosis: Term Pregnancy Delivered                                                                                                Post partum procedures:n/a  Augmentation: N/A  Complications: None  Hospital course:  Sceduled C/S   43 y.o. yo G2P1001 at 64105w6d was admitted to the hospital 10/21/2018 for scheduled cesarean section with the following indication:Malpresentation.  Membrane Rupture Time/Date: 5:42 AM ,10/22/2018   Patient delivered a Viable infant.10/22/2018  Details of operation can be found in separate operative note.  Pateint had an uncomplicated postpartum course.  She is ambulating, tolerating a regular diet, passing flatus, and urinating well. Patient is discharged home in stable condition on  10/27/18         Physical exam  Vitals:   10/26/18 2208 10/27/18 0104 10/27/18 0531 10/27/18 0759  BP: (!) 147/87 (!) 154/85 (!) 151/83 (!) 168/88  Pulse: 86 84 84 96  Resp: 18 18 18 18   Temp: 98.1 F (36.7 C) 97.9 F (36.6 C) 97.8 F (36.6 C) 97.6 F (36.4 C)  TempSrc: Oral Oral Oral Oral  SpO2:    98%  Weight:      Height:       General: alert and no distress Lochia: appropriate Uterine Fundus: firm Incision: Healing well with no significant drainage DVT Evaluation: No evidence of DVT seen on physical exam. Labs: Lab Results  Component Value Date   WBC 7.3 10/27/2018   HGB 12.7 10/27/2018   HCT 37.6 10/27/2018   MCV 97.7 10/27/2018   PLT 262 10/27/2018   CMP Latest Ref Rng & Units 10/27/2018  Glucose 70 - 99 mg/dL 95  BUN 6 - 20 mg/dL 13   Creatinine 6.570.44 - 1.00 mg/dL 8.460.91  Sodium 962135 - 952145 mmol/L 141  Potassium 3.5 - 5.1 mmol/L 3.8  Chloride 98 - 111 mmol/L 107  CO2 22 - 32 mmol/L 24  Calcium 8.9 - 10.3 mg/dL 8.0(L)  Total Protein 6.5 - 8.1 g/dL 4.7(L)  Total Bilirubin 0.3 - 1.2 mg/dL 8.4(X0.2(L)  Alkaline Phos 38 - 126 U/L 96  AST 15 - 41 U/L 34  ALT 0 - 44 U/L 36    Discharge instruction: per After Visit Summary and "Baby and Me Booklet".  After visit meds:  Allergies as of 10/27/2018      Reactions   Hydrocodone Other (See Comments)   Skin crawls   Oxycodone    Feels like bugs crawling on her    Keflex [cephalexin] Palpitations   Patient unsure of allergy    Tape Rash   Red splotches      Medication List    TAKE these medications  ibuprofen 800 MG tablet Commonly known as:  ADVIL Take 1 tablet (800 mg total) by mouth every 8 (eight) hours as needed for moderate pain (mild pain).   labetalol 200 MG tablet Commonly known as:  NORMODYNE Take 1 tablet (200 mg total) by mouth 2 (two) times daily.   NIFEdipine 90 MG 24 hr tablet Commonly known as:  PROCARDIA XL/NIFEDICAL-XL Take 1 tablet (90 mg total) by mouth daily.   oxyCODONE 5 MG immediate release tablet Commonly known as:  Oxy IR/ROXICODONE Take 1 tablet (5 mg total) by mouth every 6 (six) hours as needed for moderate pain or severe pain.   prenatal multivitamin Tabs tablet Take 1 tablet by mouth daily at 12 noon.   sertraline 100 MG tablet Commonly known as:  ZOLOFT Take 100 mg by mouth daily.       Diet: routine diet  Activity: Advance as tolerated. Pelvic rest for 6 weeks.   Outpatient follow up:1, 2 and 6 weeks Follow up Appt:No future appointments. Follow up Visit:No follow-ups on file.  Postpartum contraception: Undecided  Newborn Data: Live born female  Birth Weight: 5 lb 13.1 oz (2639 g) APGAR: 9, 9  Newborn Delivery   Birth date/time:  10/22/2018 05:43:00 Delivery type:  C-Section, Low Transverse Trial of labor:   No C-section categorization:  Primary     Baby Feeding: Breast Disposition:home with mother   10/27/2018 Sherian Rein, MD

## 2018-10-27 NOTE — Lactation Note (Signed)
This note was copied from a baby's chart. Lactation Consultation Note  Patient Name: Martha Mason [id number] Date: 10/27/2018 Reason for consult: Follow-up assessment;NICU baby;Early term 37-38.6wks  1218 - 1231 - I visited Martha Mason to check on her progress with breast feeding and pumping. Martha Mason was holding her baby in NICU and states that she just breast fed her daughter. She states that her daughter latches well.   We discussed pumping frequency and output. Mom states that she pumped 70 ml earlier today. She is not pumping every three hours with compliance. She states that she needed to rest last night and did not pump as frequently (11 pm, 5 am, and 8 am). She had not pumped since 8 am.  I reviewed pumping guidelines and encouraged mom to pump at least 8 times a day. I encouraged her to cluster pumps if she needed to "catch up a bit" today, and I reviewed the importance of nighttime pumping. Mom has had preeclampsia and has needed her rest, however.  Martha Mason will be discharged today. I encouraged her to call an LC for latch assistance while with her daughter in the NICU and to call for an OP appointment once discharged. Martha Mason verbalized understanding.   Maternal Data Formula Feeding for Exclusion: No Has patient been taught Hand Expression?: Yes Does the patient have breastfeeding experience prior to this delivery?: Yes  Feeding Feeding Type: Breast Milk  Interventions Interventions: Breast feeding basics reviewed;DEBP  Lactation Tools Discussed/Used Pump Review: Setup, frequency, and cleaning;Milk Storage   Consult Status Consult Status: PRN Follow-up type: Call as needed    Walker Shadow 10/27/2018, 1:58 PM

## 2018-12-14 DIAGNOSIS — R002 Palpitations: Secondary | ICD-10-CM | POA: Insufficient documentation

## 2018-12-14 NOTE — Progress Notes (Signed)
Cardiology Office Note:    Date:  12/15/2018   ID:  Martha, Mason April 22, 1976, MRN [id number]  PCP:  Lennie Odor, PA-C  Cardiologist:  Shirlee More, MD   Referring MD: Lennie Odor, PA-C  ASSESSMENT:    1. Pre-eclampsia in third trimester   2. Palpitations   3. MVP (mitral valve prolapse)    PLAN:    In order of problems listed above:  1. Presently her blood pressure is relatively low labetalol was stopped she may be having reflux sinus tachycardia have asked her to stop her calcium channel blocker contact me if blood pressures greater than 140/90 and gave her prescription for as needed beta-blocker for palpitation 2. Discontinue Procardia beta-blocker as needed 3. Recheck echocardiogram  Next appointment 6 weeks   Medication Adjustments/Labs and Tests Ordered: Current medicines are reviewed at length with the patient today.  Concerns regarding medicines are outlined above.  No orders of the defined types were placed in this encounter.  No orders of the defined types were placed in this encounter.    Chief Complaint  Patient presents with  . Palpitations    History of Present Illness:    Martha Mason is a 43 y.o. female who is being seen today for the evaluation of palpitation at the request of Redmon, Noelle, PA-C.  She had preeclampsia with pregnancy and admission to the hospital 10/22/2018.  She has had episodes where her heart feels forceful and rapid and actually was sore in her chest associated with it.  They were discrete intermittent 1 of them lasted for an hour and think she was having trouble with her heart rhythm.  She is off labetalol still takes Procardia.  No exertional chest pain dyspnea or syncope.  Her blood pressure is relatively low she may be having reflex tachycardia from the strong vasodilator effect of Procardia will discontinue monitor home blood pressure.  Her EKG is abnormal is at risk for structural heart disease hypertrophy and  progressive mitral regurgitation undergo echocardiogram.  We discussed I asked her to purchase the smart phone adapter to record episodes.  I will see her back in my office in 6 weeks follow-up for testing blood pressure and any documented arrhythmia. Past Medical History:  Diagnosis Date  . Anxiety   . Endometrial polyp 06/30/2013  . Head injuries    Closed head injury   . Heart murmur   . Kidney stone   . Kidney stones   . Meningitis    At age 56  . Mitral valve prolapse   . MVP (mitral valve prolapse)   . 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 Preeclampsia   . Pregnancy induced hypertension   . Retained placenta after delivery without hemorrhage but with other complication 08/31/7024  . Retained products of conception, postpartum 01/16/2016  . SVD (spontaneous vaginal delivery) 12/24/2015  . Vertigo     Past Surgical History:  Procedure Laterality Date  . CESAREAN SECTION N/A 10/22/2018   Procedure: CESAREAN SECTION;  Surgeon: Paula Compton, MD;  Location: [facility];  Service: Obstetrics;  Laterality: N/A;  . DILATATION & CURETTAGE/HYSTEROSCOPY WITH TRUECLEAR N/A 06/30/2013   Procedure: DILATATION & HYSTEROSCOPY/POLYPECTOMY WITH TRUCLEAR;  Surgeon: Elveria Royals, MD;  Location: [facility];  Service: Gynecology;  Laterality: N/A;  . DILATION AND CURETTAGE OF UTERUS N/A 12/24/2015   Procedure: DILATATION AND CURETTAGE;  Surgeon: Janyth Contes, MD;  Location: [facility];  Service: Obstetrics;  Laterality: N/A;  .  DILATION AND EVACUATION N/A 01/16/2016   Procedure: DILATATION AND Evacuation with  ultrasound guide;  Surgeon: Sherian ReinJody Bovard-Stuckert, MD;  Location: [facility];  Service: Gynecology;  Laterality: N/A;  . NO PAST SURGERIES    . RENAL ARTERY STENT Left march 2014  . uterine polyp      Current Medications: Current Meds  Medication Sig  . NIFEdipine (PROCARDIA XL/NIFEDICAL-XL) 90 MG 24 hr tablet Take 1 tablet (90 mg total) by mouth daily.  .  Prenatal Vit-Fe Fumarate-FA (PRENATAL MULTIVITAMIN) TABS tablet Take 1 tablet by mouth daily at 12 noon.     Allergies:   Hydrocodone, Oxycodone, Keflex [cephalexin], and Tape   Social History   Socioeconomic History  . Marital status: Married    Spouse name: Not on file  . Number of children: 0  . Years of education: Masters  . Highest education level: Not on file  Occupational History  . Occupation: Runner, broadcasting/film/videoteacher   Social Needs  . Financial resource strain: Not hard at all  . Food insecurity    Worry: Never true    Inability: Never true  . Transportation needs    Medical: No    Non-medical: Not on file  Tobacco Use  . Smoking status: Never Smoker  . Smokeless tobacco: Never Used  Substance and Sexual Activity  . Alcohol use: No    Alcohol/week: 0.0 standard drinks  . Drug use: No  . Sexual activity: Yes  Lifestyle  . Physical activity    Days per week: Not on file    Minutes per session: Not on file  . Stress: Rather much  Relationships  . Social Musicianconnections    Talks on phone: Not on file    Gets together: Not on file    Attends religious service: Not on file    Active member of club or organization: Not on file    Attends meetings of clubs or organizations: Not on file    Relationship status: Not on file  Other Topics Concern  . Not on file  Social History Narrative   Rare caffeine intake      Family History: The patient's family history includes Alzheimer's disease in her paternal grandfather; Heart attack in her father; Lung cancer in her mother; Prostate cancer in her father; Stroke in her paternal grandmother.  ROS:   ROS Please see the history of present illness.     All other systems reviewed and are negative.  EKGs/Labs/Other Studies Reviewed:    The following studies were reviewed today:   EKG:  EKG is  ordered today.  The ekg ordered today is personally reviewed and demonstrates sinus rhythm right atrial enlargement diffuse ST-T abnormality consider  LVH  Recent Labs: 10/27/2018: ALT 36; BUN 13; Creatinine, Ser 0.91; Hemoglobin 12.7; Platelets 262; Potassium 3.8; Sodium 141  Recent Lipid Panel No results found for: CHOL, TRIG, HDL, CHOLHDL, VLDL, LDLCALC, LDLDIRECT  Physical Exam:    VS:  BP 110/66 (BP Location: Right Arm, Patient Position: Sitting, Cuff Size: Normal)   Pulse (!) 101   Temp 98.2 F (36.8 C)   Ht 5\' 9"  (1.753 m)   Wt 154 lb (69.9 kg)   SpO2 97%   BMI 22.74 kg/m     Wt Readings from Last 3 Encounters:  12/15/18 154 lb (69.9 kg)  10/21/18 178 lb 11.2 oz (81.1 kg)  01/16/16 141 lb (64 kg)     GEN:  Well nourished, well developed in no acute distress HEENT: Normal NECK: No JVD;  No carotid bruits LYMPHATICS: No lymphadenopathy CARDIAC: 1/6 MR RRR, no murmurs, rubs, gallops RESPIRATORY:  Clear to auscultation without rales, wheezing or rhonchi  ABDOMEN: Soft, non-tender, non-distended MUSCULOSKELETAL:  No edema; No deformity  SKIN: Warm and dry NEUROLOGIC:  Alert and oriented x 3 PSYCHIATRIC:  Normal affect     Signed, Norman HerrlichBrian Munley, MD  12/15/2018 12:04 PM    [facility]

## 2018-12-15 ENCOUNTER — Ambulatory Visit (INDEPENDENT_AMBULATORY_CARE_PROVIDER_SITE_OTHER): Payer: BC Managed Care – PPO | Admitting: Cardiology

## 2018-12-15 ENCOUNTER — Other Ambulatory Visit: Payer: Self-pay

## 2018-12-15 ENCOUNTER — Encounter: Payer: Self-pay | Admitting: Cardiology

## 2018-12-15 ENCOUNTER — Other Ambulatory Visit: Payer: Self-pay | Admitting: Cardiology

## 2018-12-15 VITALS — BP 110/66 | HR 101 | Temp 98.2°F | Ht 69.0 in | Wt 154.0 lb

## 2018-12-15 DIAGNOSIS — O1493 Unspecified pre-eclampsia, third trimester: Secondary | ICD-10-CM

## 2018-12-15 DIAGNOSIS — R002 Palpitations: Secondary | ICD-10-CM

## 2018-12-15 DIAGNOSIS — I341 Nonrheumatic mitral (valve) prolapse: Secondary | ICD-10-CM

## 2018-12-15 MED ORDER — METOPROLOL TARTRATE 25 MG PO TABS: 25.0000 mg | ORAL_TABLET | Freq: Four times a day (QID) | ORAL | 0 refills | Status: DC

## 2018-12-15 NOTE — Patient Instructions (Addendum)
.Medication Instructions:  Your physician has recommended you make the following change in your medication: STOP: PROCARDIA START: METOPROLOL 25 mg every 6 hours for rapid HR  If you need a refill on your cardiac medications before your next appointment, please call your pharmacy.   Lab work: NONE If you have labs (blood work) drawn today and your tests are completely normal, you will receive your results only by:  [facility] (if you have MyChart) OR  A paper copy in the mail If you have any lab test that is abnormal or we need to change your treatment, we will call you to review the results.  Testing/Procedures: Echocardiogram  Follow-Up: At [facility], you and your health needs are our priority.  As part of our continuing mission to provide you with exceptional heart care, we have created designated Provider Care Teams.  These Care Teams include your primary Cardiologist (physician) and Advanced Practice Providers (APPs -  Physician Assistants and Nurse Practitioners) who all work together to provide you with the care you need, when you need it. You will need a follow up appointment in 4 weeks.   Any Other Special Instructions Will Be Listed Below (If Applicable). Check BP Twice Daily and call if Greater than 140/90                   [employer]beat  Tips to measure your blood pressure correctly  To determine whether you have hypertension, a medical professional will take a blood pressure reading. How you prepare for the test, the position of your arm, and other factors can change a blood pressure reading by 10% or more. That could be enough to hide high blood pressure, start you on a drug you don't really need, or lead your doctor to incorrectly adjust your medications. National and international guidelines offer specific instructions for measuring blood pressure. If a doctor, nurse, or medical assistant isn't doing it right, don't hesitate to ask him or her to  get with the guidelines. Here's what you can do to ensure a correct reading:  Don't drink a caffeinated beverage or smoke during the 30 minutes before the test.  Sit quietly for five minutes before the test begins.  During the measurement, sit in a chair with your feet on the floor and your arm supported so your elbow is at about heart level.  The inflatable part of the cuff should completely cover at least 80% of your upper arm, and the cuff should be placed on bare skin, not over a shirt.  Don't talk during the measurement.  Have your blood pressure measured twice, with a brief break in between. If the readings are different by 5 points or more, have it done a third time. There are times to break these rules. If you sometimes feel lightheaded when getting out of bed in the morning or when you stand after sitting, you should have your blood pressure checked while seated and then while standing to see if it falls from one position to the next. Because blood pressure varies throughout the day, your doctor will rarely diagnose hypertension on the basis of a single reading. Instead, he or she will want to confirm the measurements on at least two occasions, usually within a few weeks of one another. The exception to this rule is if you have a blood pressure reading of 180/110 mm Hg or higher. A result this high usually calls for prompt treatment. It's also a good idea to have your  blood pressure measured in both arms at least once, since the reading in one arm (usually the right) may be higher than that in the left. A 2014 study in The American Journal of Medicine of nearly 3,400 people found average arm- to-arm differences in systolic blood pressure of about 5 points. The higher number should be used to make treatment decisions. In 2017, new guidelines from the American Heart Association, the [employer] College of [employer]iology, and nine other health organizations lowered the diagnosis of high blood  pressure to 130/80 mm Hg or higher for all adults. The guidelines also redefined the various blood pressure categories to now include normal, elevated, Stage 1 hypertension, Stage 2 hypertension, and hypertensive crisis (see "Blood pressure categories"). Blood pressure categories  Blood pressure category SYSTOLIC (upper number)  DIASTOLIC (lower number)  Normal Less than 120 mm Hg and Less than 80 mm Hg  Elevated 120-129 mm Hg and Less than 80 mm Hg  High blood pressure: Stage 1 hypertension 130-139 mm Hg or 80-89 mm Hg  High blood pressure: Stage 2 hypertension 140 mm Hg or higher or 90 mm Hg or higher  Hypertensive crisis (consult your doctor immediately) Higher than 180 mm Hg and/or Higher than 120 mm Hg  Source: American Heart Association and American Stroke Association. For more on getting your blood pressure under control, buy Controlling Your Blood Pressure, a Special Health Report from [facility].Kardia[employer] [email or web address]://store.alivecor.com/products/kardiamobile        FDA-cleared, clinical grade mobile EKG monitor: Lourena SimmondsKardia is the most clinically-validated mobile EKG used by the world's leading cardiac care medical professionals With Basic service, know instantly if your heart rhythm is normal or if atrial fibrillation is detected, and email the last single EKG recording to yourself or your doctor Premium service, available for purchase through the Kardia app for $9.99 per month or $99 per year, includes unlimited history and storage of your EKG recordings, a monthly EKG summary report to share with your doctor, along with the ability to track your blood pressure, activity and weight Includes one KardiaMobile phone clip FREE SHIPPING: Standard delivery 1-3 business days. Orders placed by 11:00am PST will ship that afternoon. Otherwise, will ship next business day. All orders ship via [employer] Priority Mail from [place]

## 2018-12-16 MED ORDER — METOPROLOL TARTRATE 25 MG PO TABS: 25.0000 mg | ORAL_TABLET | Freq: Four times a day (QID) | ORAL | 0 refills | Status: DC | PRN

## 2018-12-23 ENCOUNTER — Other Ambulatory Visit: Payer: Self-pay

## 2018-12-23 ENCOUNTER — Ambulatory Visit (HOSPITAL_BASED_OUTPATIENT_CLINIC_OR_DEPARTMENT_OTHER)
Admission: RE | Admit: 2018-12-23 | Discharge: 2018-12-23 | Disposition: A | Discharge status: Home / Self Care | Payer: BC Managed Care – PPO | Source: Ambulatory Visit | Attending: Cardiology | Admitting: Cardiology

## 2018-12-23 DIAGNOSIS — I341 Nonrheumatic mitral (valve) prolapse: Secondary | ICD-10-CM | POA: Diagnosis present

## 2018-12-23 DIAGNOSIS — R002 Palpitations: Secondary | ICD-10-CM | POA: Insufficient documentation

## 2018-12-28 ENCOUNTER — Telehealth: Payer: Self-pay | Admitting: Cardiology

## 2019-03-17 ENCOUNTER — Encounter (INDEPENDENT_AMBULATORY_CARE_PROVIDER_SITE_OTHER): Payer: Self-pay

## 2019-05-05 ENCOUNTER — Telehealth: Payer: Self-pay | Admitting: Cardiology

## 2019-05-05 NOTE — Telephone Encounter (Signed)
Pt. Is requesting a call back about her Mitral Valve prolapse. She says that she has to go back to work soon and she would like to know what to do as far as her condition. She says that she needs documentation from the Dr. Before she returns.

## 2019-05-08 NOTE — Telephone Encounter (Signed)
Patient is returning your call, however she is in school from 1:10 until 2:25 so if you can call after that, she would like that

## 2019-05-08 NOTE — Telephone Encounter (Signed)
Left message to return call to discuss.  

## 2019-05-08 NOTE — Telephone Encounter (Signed)
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 She reports her classroom can sometimes get up to 85 degrees and she gets overheated wearing a mask and that can cause palpitations. Please advise if a letter can be provided. Thanks!

## 2019-05-08 NOTE — Telephone Encounter (Signed)
I saw her once in July. Best to discuss with her PCP

## 2019-05-10 NOTE — Telephone Encounter (Signed)
Patient called back yesterday and Dr. Joya Gaskins recommendation was relayed to her by Langley Gauss at the front desk. No further questions.

## 2019-07-24 ENCOUNTER — Ambulatory Visit: Payer: BC Managed Care – PPO | Attending: Internal Medicine

## 2019-07-24 DIAGNOSIS — Z23 Encounter for immunization: Secondary | ICD-10-CM | POA: Insufficient documentation

## 2019-07-24 NOTE — Progress Notes (Signed)
   Covid-19 Vaccination Clinic  Name:  Martha Mason    MRN: [id number] DOB: [birth year]  07/24/2019  [name] was observed post Covid-19 immunization for 15 minutes without incidence. She was provided with Vaccine Information Sheet and instruction to access the V-Safe system.   [name] was instructed to call 911 with any severe reactions post vaccine: Marland Kitchen Difficulty breathing  . Swelling of your face and throat  . A fast heartbeat  . A bad rash all over your body  . Dizziness and weakness    Immunizations Administered    Name Date Dose VIS Date Route   Pfizer COVID-19 Vaccine 07/24/2019 10:29 AM 0.3 mL 05/05/2019 Intramuscular   Manufacturer: [employer], [employer]   Lot: [id number]   NDC: [address]-8

## 2019-08-22 ENCOUNTER — Ambulatory Visit: Payer: BC Managed Care – PPO | Attending: Internal Medicine

## 2019-08-22 DIAGNOSIS — Z23 Encounter for immunization: Secondary | ICD-10-CM

## 2019-08-22 NOTE — Progress Notes (Signed)
   Covid-19 Vaccination Clinic  Name:  LAKEITHIA RASOR    MRN: [id number] DOB: 11-10-1975  08/22/2019  Ms. Mcpherson was observed post Covid-19 immunization for 15 minutes without incident. She was provided with Vaccine Information Sheet and instruction to access the V-Safe system.   Ms. Zirkle was instructed to call 911 with any severe reactions post vaccine: Marland Kitchen Difficulty breathing  . Swelling of face and throat  . A fast heartbeat  . A bad rash all over body  . Dizziness and weakness   Immunizations Administered    Name Date Dose VIS Date Route   Pfizer COVID-19 Vaccine 08/22/2019 10:38 AM 0.3 mL 05/05/2019 Intramuscular   Manufacturer: [employer], [employer]   Lot: [id number]   NDC: [address]-4

## 2019-10-26 ENCOUNTER — Other Ambulatory Visit: Payer: Self-pay | Admitting: Physician Assistant

## 2019-10-26 ENCOUNTER — Ambulatory Visit
Admission: RE | Admit: 2019-10-26 | Discharge: 2019-10-26 | Disposition: A | Discharge status: Home / Self Care | Payer: BC Managed Care – PPO | Source: Ambulatory Visit | Attending: Physician Assistant | Admitting: Physician Assistant

## 2019-10-26 DIAGNOSIS — R059 Cough, unspecified: Secondary | ICD-10-CM

## 2019-11-29 ENCOUNTER — Other Ambulatory Visit: Payer: Self-pay | Admitting: Physician Assistant

## 2019-11-29 ENCOUNTER — Ambulatory Visit
Admission: RE | Admit: 2019-11-29 | Discharge: 2019-11-29 | Disposition: A | Discharge status: Home / Self Care | Payer: BC Managed Care – PPO | Source: Ambulatory Visit | Attending: Physician Assistant | Admitting: Physician Assistant

## 2019-11-29 DIAGNOSIS — J189 Pneumonia, unspecified organism: Secondary | ICD-10-CM

## 2020-04-06 ENCOUNTER — Ambulatory Visit: Payer: BC Managed Care – PPO | Attending: Internal Medicine

## 2020-04-06 DIAGNOSIS — Z23 Encounter for immunization: Secondary | ICD-10-CM

## 2020-04-06 NOTE — Progress Notes (Signed)
° °  Covid-19 Vaccination Clinic  Name:  Martha Mason    MRN: [id number] DOB: November 17, 1975  04/06/2020  [name] was observed post Covid-19 immunization for 15 minutes without incident. She was provided with Vaccine Information Sheet and instruction to access the V-Safe system.   [name] was instructed to call 911 with any severe reactions post vaccine:  Difficulty breathing   Swelling of face and throat   A fast heartbeat   A bad rash all over body   Dizziness and weakness   Immunizations Administered    Name Date Dose VIS Date Route   Pfizer COVID-19 Vaccine 04/06/2020  3:19 PM 0.3 mL 03/13/2020 Intramuscular   Manufacturer: [employer], [employer]   Lot: [id number]   NDC: [address]-5

## 2020-12-23 ENCOUNTER — Telehealth: Payer: Self-pay | Admitting: Cardiology

## 2020-12-23 NOTE — Telephone Encounter (Signed)
Spoke to the patient just now and let her know Dr. Hulen Shouts recommendations. She verbalizes understanding and states that she will call her PCP for this.    Encouraged patient to call back with any questions or concerns.

## 2020-12-23 NOTE — Telephone Encounter (Signed)
Patient called in seeing if she can get a letting stating her medical condition. So that she can get medical leave without having to resign. Please advise

## 2021-02-04 DIAGNOSIS — Z01411 Encounter for gynecological examination (general) (routine) with abnormal findings: Secondary | ICD-10-CM | POA: Diagnosis not present

## 2021-02-04 DIAGNOSIS — R5381 Other malaise: Secondary | ICD-10-CM | POA: Diagnosis not present

## 2021-02-04 DIAGNOSIS — Z1389 Encounter for screening for other disorder: Secondary | ICD-10-CM | POA: Diagnosis not present

## 2021-02-04 DIAGNOSIS — Z130 Encounter for screening for diseases of the blood and blood-forming organs and certain disorders involving the immune mechanism: Secondary | ICD-10-CM | POA: Diagnosis not present

## 2021-02-04 DIAGNOSIS — Z6823 Body mass index (BMI) 23.0-23.9, adult: Secondary | ICD-10-CM | POA: Diagnosis not present

## 2021-02-04 DIAGNOSIS — R5383 Other fatigue: Secondary | ICD-10-CM | POA: Diagnosis not present

## 2021-02-06 ENCOUNTER — Other Ambulatory Visit: Payer: Self-pay | Admitting: Obstetrics and Gynecology

## 2021-02-06 DIAGNOSIS — Z09 Encounter for follow-up examination after completed treatment for conditions other than malignant neoplasm: Secondary | ICD-10-CM

## 2021-02-10 ENCOUNTER — Other Ambulatory Visit: Payer: Self-pay | Admitting: Family Medicine

## 2021-02-10 ENCOUNTER — Other Ambulatory Visit: Payer: Self-pay | Admitting: Obstetrics and Gynecology

## 2021-02-10 DIAGNOSIS — N6489 Other specified disorders of breast: Secondary | ICD-10-CM

## 2021-02-20 ENCOUNTER — Other Ambulatory Visit: Payer: Self-pay | Admitting: Obstetrics and Gynecology

## 2021-02-20 DIAGNOSIS — N6489 Other specified disorders of breast: Secondary | ICD-10-CM

## 2021-02-26 ENCOUNTER — Other Ambulatory Visit: Payer: Self-pay

## 2021-02-26 ENCOUNTER — Ambulatory Visit: Payer: BC Managed Care – PPO

## 2021-02-26 ENCOUNTER — Ambulatory Visit
Admission: RE | Admit: 2021-02-26 | Discharge: 2021-02-26 | Disposition: A | Discharge status: Home / Self Care | Payer: BC Managed Care – PPO | Source: Ambulatory Visit | Attending: Obstetrics and Gynecology | Admitting: Obstetrics and Gynecology

## 2021-02-26 DIAGNOSIS — N6489 Other specified disorders of breast: Secondary | ICD-10-CM | POA: Diagnosis not present

## 2021-02-26 DIAGNOSIS — R922 Inconclusive mammogram: Secondary | ICD-10-CM | POA: Diagnosis not present

## 2021-03-28 DIAGNOSIS — D2230 Melanocytic nevi of unspecified part of face: Secondary | ICD-10-CM | POA: Diagnosis not present

## 2021-03-28 DIAGNOSIS — Z23 Encounter for immunization: Secondary | ICD-10-CM | POA: Diagnosis not present

## 2021-04-03 DIAGNOSIS — L821 Other seborrheic keratosis: Secondary | ICD-10-CM | POA: Diagnosis not present

## 2021-04-03 DIAGNOSIS — Z23 Encounter for immunization: Secondary | ICD-10-CM | POA: Diagnosis not present

## 2021-04-03 DIAGNOSIS — L820 Inflamed seborrheic keratosis: Secondary | ICD-10-CM | POA: Diagnosis not present

## 2021-04-03 DIAGNOSIS — D1721 Benign lipomatous neoplasm of skin and subcutaneous tissue of right arm: Secondary | ICD-10-CM | POA: Diagnosis not present

## 2021-04-11 ENCOUNTER — Telehealth: Payer: Self-pay | Admitting: Cardiology

## 2021-04-11 NOTE — Telephone Encounter (Signed)
Spoke to the patient just now and she let me know that she has been having issues with her blood pressure being high. She tells me that today it was 149/79 HR is 96. She tells me that feels like her heart rate gets high and feels as if she will pass out at this time. She does not check her heart rate to know how fast it is.   At this time Dr. Hulen Shouts recommendation is for her to reach out to her PCP to schedule an appointment with them. Especially since we have not seen the patient since 11/2018.   She verbalizes understanding and thanks me for calling her back.

## 2021-04-11 NOTE — Telephone Encounter (Signed)
New Message:    Patient would like to be seen please. Having problems with her blood pressure. First available appointment is January.   Pt c/o BP issue: STAT if pt c/o blurred vision, one-sided weakness or slurred speech  1. What are your la st 5 BP readings? 154/86, 151- she could not remeber the readings , these are high readings for her   2. Are you having any other symptoms (ex. Dizziness, headache, blurred vision, passed out)? Her heart is racing, feels like she is going to pass out, it gets black,but she does not pass out worse,   3. What is your BP issue? High bloodpressure

## 2021-04-14 DIAGNOSIS — I341 Nonrheumatic mitral (valve) prolapse: Secondary | ICD-10-CM | POA: Diagnosis not present

## 2021-04-14 DIAGNOSIS — R002 Palpitations: Secondary | ICD-10-CM | POA: Diagnosis not present

## 2021-04-14 DIAGNOSIS — F419 Anxiety disorder, unspecified: Secondary | ICD-10-CM | POA: Diagnosis not present

## 2021-04-23 DIAGNOSIS — R079 Chest pain, unspecified: Secondary | ICD-10-CM | POA: Diagnosis not present

## 2021-04-23 DIAGNOSIS — R5383 Other fatigue: Secondary | ICD-10-CM | POA: Diagnosis not present

## 2021-05-08 DIAGNOSIS — D1721 Benign lipomatous neoplasm of skin and subcutaneous tissue of right arm: Secondary | ICD-10-CM | POA: Diagnosis not present

## 2021-05-08 DIAGNOSIS — L814 Other melanin hyperpigmentation: Secondary | ICD-10-CM | POA: Diagnosis not present

## 2021-05-08 DIAGNOSIS — L821 Other seborrheic keratosis: Secondary | ICD-10-CM | POA: Diagnosis not present

## 2021-05-08 DIAGNOSIS — L578 Other skin changes due to chronic exposure to nonionizing radiation: Secondary | ICD-10-CM | POA: Diagnosis not present

## 2021-05-09 DIAGNOSIS — S0990XA Unspecified injury of head, initial encounter: Secondary | ICD-10-CM | POA: Insufficient documentation

## 2021-05-09 DIAGNOSIS — N200 Calculus of kidney: Secondary | ICD-10-CM | POA: Insufficient documentation

## 2021-05-09 DIAGNOSIS — R011 Cardiac murmur, unspecified: Secondary | ICD-10-CM | POA: Insufficient documentation

## 2021-05-09 DIAGNOSIS — F419 Anxiety disorder, unspecified: Secondary | ICD-10-CM | POA: Insufficient documentation

## 2021-05-09 DIAGNOSIS — G039 Meningitis, unspecified: Secondary | ICD-10-CM | POA: Insufficient documentation

## 2021-05-09 DIAGNOSIS — I341 Nonrheumatic mitral (valve) prolapse: Secondary | ICD-10-CM | POA: Insufficient documentation

## 2021-05-09 DIAGNOSIS — R42 Dizziness and giddiness: Secondary | ICD-10-CM | POA: Insufficient documentation

## 2021-05-26 NOTE — Progress Notes (Signed)
Cardiology Office Note:    Date:  05/27/2021   ID:  Martha Mason, DOB 05-Oct-1975, MRN [id number]  PCP:  Lennie Odor, PA  Cardiologist:  Shirlee More, MD    Referring MD: Lennie Odor, PA    ASSESSMENT:    1. Palpitations   2. Elevated blood pressure reading    PLAN:    In order of problems listed above:  Symptoms are improved related to a multitude of stressors in her life she has a prescription for beta-blocker that she can take as needed as needed Normal blood pressure today encouraged her to check her blood pressure on a regular basis at home good technique tips and record if she is at increased risk of hypertension in life with hypertensive disorder pregnancy   Next appointment: As needed   Medication Adjustments/Labs and Tests Ordered: Current medicines are reviewed at length with the patient today.  Concerns regarding medicines are outlined above.  Orders Placed This Encounter  Procedures   EKG 12-Lead   Meds ordered this encounter  Medications   metoprolol tartrate (LOPRESSOR) 25 MG tablet    Sig: Take 1 tablet (25 mg total) by mouth every 6 (six) hours as needed (For Rapid Heart Rate).    Dispense:  30 tablet    Refill:  0    Chief Complaint  Patient presents with   Palpitations    History of Present Illness:    Martha Mason is a 46 y.o. female with a hx of hypertensive disorder pregnancy with preeclampsia in the third trimester pregnancy and hospital admission May 2020 last seen 12/15/2018 for palpitation.  She contacted our office 04/11/2021 with complaints of heart rate and rapid blood pressure being elevated and was advised to be seen by her primary care physician.  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 She does not check her blood pressure at home on a regular basis No chest pain edema or syncope  She underwent an echocardiogram reported 12/23/2018 showing normal left ventricular size wall thickness systolic and diastolic function.  Right ventricle is normal in size function and wall thickness and the aorta was also normal.  1. The left ventricle has normal systolic function with an ejection  fraction of 60-65%. The cavity size was normal. Left ventricular diastolic  parameters were normal.   2. The right ventricle has normal systolic function. The cavity was  normal. There is no increase in right ventricular wall thickness.   3. The aorta is normal in size and structure.   Recent labs from [place] 04/23/2021: Hemoglobin 14.2 platelets 207,000 MCV normal at 90.4 CMP normal glucose 103 creatinine 0.84 sodium 140 potassium 4.1 GFR 86 cc/min  Past Medical History:  Diagnosis Date   Anxiety    Endometrial polyp 06/30/2013   Head injuries    Closed head injury    Heart murmur    Kidney stone    Kidney stones    Meningitis    At age 53   Mitral valve prolapse    MVP (mitral valve prolapse)    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 -  abnormal placentation   Preeclampsia    Pregnancy induced hypertension    Retained placenta after delivery without hemorrhage but with other complication [id number]   Retained products of conception, postpartum 01/16/2016   SVD (spontaneous vaginal delivery) 12/24/2015   Vertigo     Past Surgical History:  Procedure Laterality Date   CESAREAN SECTION N/A 10/22/2018   Procedure: CESAREAN SECTION;  Surgeon: Paula Compton, MD;  Location: [facility];  Service: Obstetrics;  Laterality: N/A;   DILATATION & CURETTAGE/HYSTEROSCOPY WITH TRUECLEAR N/A 06/30/2013   Procedure: DILATATION & HYSTEROSCOPY/POLYPECTOMY  WITH TRUCLEAR;  Surgeon: Elveria Royals, MD;  Location: [place] [facility];  Service: Gynecology;  Laterality: N/A;   DILATION AND CURETTAGE OF UTERUS N/A 12/24/2015   Procedure: DILATATION AND CURETTAGE;  Surgeon: Janyth Contes, MD;  Location: [place];  Service: Obstetrics;  Laterality: N/A;   DILATION AND EVACUATION N/A 01/16/2016   Procedure: DILATATION AND Evacuation with  ultrasound guide;  Surgeon: Janyth Contes, MD;  Location: [place] [facility];  Service: Gynecology;  Laterality: N/A;   NO PAST SURGERIES     RENAL ARTERY STENT Left march 2014   uterine polyp      Current Medications: Current Meds  Medication Sig   [DISCONTINUED] metoprolol tartrate (LOPRESSOR) 25 MG tablet Take 1 tablet (25 mg total) by mouth every 6 (six) hours as needed (For Rapid Heart Rate).     Allergies:   Hydrocodone, Oxycodone, Keflex [cephalexin], and Tape   Social History   Socioeconomic History   Marital status: Married    Spouse name: Not on file   Number of children: 0   Years of education: Masters   Highest education level: Not on file  Occupational History   Occupation: Pharmacist, hospital   Tobacco Use   Smoking status: Never   Smokeless tobacco: Never  Vaping Use   Vaping Use: Never used  Substance and Sexual Activity   Alcohol use: No    Alcohol/week: 0.0 standard drinks   Drug use: No   Sexual activity: Yes  Other Topics Concern   Not on file  Social History Narrative   Rare caffeine intake    Social Determinants of Health   Financial Resource Strain: Not on file  Food Insecurity: Not on file  Transportation Needs: Not on file  Physical Activity: Not on file  Stress: Not on file  Social Connections: Not on file     Family History: The patient's family history includes Alzheimer's disease in her paternal grandfather; Heart attack in her father; Lung cancer in her mother; Prostate cancer in her father; Stroke in her paternal grandmother. ROS:   Please see the history of present  illness.    All other systems reviewed and are negative.  EKGs/Labs/Other Studies Reviewed:    The following studies were reviewed today:  EKG:  EKG ordered today and personally reviewed.  The ekg ordered today demonstrates sinus rhythm tall PE in the inferior leads otherwise normal EKG  04/23/2021 hemoglobin 14.2 creatinine 0.84 potassium 4.1 TSH 1.46 all are normal  Physical Exam:    VS:  BP (!) 142/95 (BP Location: Right Arm, Patient Position: Sitting, Cuff Size: Normal)    Pulse 97    Ht 5\' 9"  (1.753 m)    Wt 163 lb 12.8 oz (74.3 kg)    SpO2 98%    BMI 24.19 kg/m     Wt Readings from Last 3 Encounters:  05/27/21 163 lb 12.8 oz (74.3 kg)  12/15/18 154 lb (69.9 kg)  10/21/18  178 lb 11.2 oz (81.1 kg)     GEN: She is a little tearful when discussing her father's illness she is concerned about his health well nourished, well developed in no acute distress HEENT: Normal NECK: No JVD; No carotid bruits LYMPHATICS: No lymphadenopathy CARDIAC: RRR, no murmurs, rubs, gallops RESPIRATORY:  Clear to auscultation without rales, wheezing or rhonchi  ABDOMEN: Soft, non-tender, non-distended MUSCULOSKELETAL:  No edema; No deformity  SKIN: Warm and dry NEUROLOGIC:  Alert and oriented x 3 PSYCHIATRIC:  Normal affect    Signed, Shirlee More, MD  05/27/2021 11:27 AM    [place]

## 2021-05-27 ENCOUNTER — Encounter: Payer: Self-pay | Admitting: Cardiology

## 2021-05-27 ENCOUNTER — Ambulatory Visit (INDEPENDENT_AMBULATORY_CARE_PROVIDER_SITE_OTHER): Payer: BC Managed Care – PPO | Admitting: Cardiology

## 2021-05-27 ENCOUNTER — Other Ambulatory Visit: Payer: Self-pay

## 2021-05-27 VITALS — BP 126/84 | HR 97 | Ht 69.0 in | Wt 163.8 lb

## 2021-05-27 DIAGNOSIS — R030 Elevated blood-pressure reading, without diagnosis of hypertension: Secondary | ICD-10-CM | POA: Diagnosis not present

## 2021-05-27 DIAGNOSIS — R002 Palpitations: Secondary | ICD-10-CM

## 2021-05-27 MED ORDER — METOPROLOL TARTRATE 25 MG PO TABS: 25.0000 mg | ORAL_TABLET | Freq: Four times a day (QID) | ORAL | 0 refills | Status: DC | PRN

## 2021-05-27 NOTE — Patient Instructions (Signed)

## 2021-07-29 DIAGNOSIS — R5383 Other fatigue: Secondary | ICD-10-CM | POA: Diagnosis not present

## 2021-07-29 DIAGNOSIS — R059 Cough, unspecified: Secondary | ICD-10-CM | POA: Diagnosis not present

## 2021-07-29 DIAGNOSIS — Z03818 Encounter for observation for suspected exposure to other biological agents ruled out: Secondary | ICD-10-CM | POA: Diagnosis not present

## 2021-07-29 DIAGNOSIS — R051 Acute cough: Secondary | ICD-10-CM | POA: Diagnosis not present

## 2021-08-05 DIAGNOSIS — F4323 Adjustment disorder with mixed anxiety and depressed mood: Secondary | ICD-10-CM | POA: Diagnosis not present

## 2021-08-15 DIAGNOSIS — F4323 Adjustment disorder with mixed anxiety and depressed mood: Secondary | ICD-10-CM | POA: Diagnosis not present

## 2021-08-20 DIAGNOSIS — F4323 Adjustment disorder with mixed anxiety and depressed mood: Secondary | ICD-10-CM | POA: Diagnosis not present

## 2021-08-25 DIAGNOSIS — F4323 Adjustment disorder with mixed anxiety and depressed mood: Secondary | ICD-10-CM | POA: Diagnosis not present

## 2021-09-02 DIAGNOSIS — F4323 Adjustment disorder with mixed anxiety and depressed mood: Secondary | ICD-10-CM | POA: Diagnosis not present

## 2021-09-16 DIAGNOSIS — F4323 Adjustment disorder with mixed anxiety and depressed mood: Secondary | ICD-10-CM | POA: Diagnosis not present

## 2021-09-22 DIAGNOSIS — F4323 Adjustment disorder with mixed anxiety and depressed mood: Secondary | ICD-10-CM | POA: Diagnosis not present

## 2021-10-03 DIAGNOSIS — F4323 Adjustment disorder with mixed anxiety and depressed mood: Secondary | ICD-10-CM | POA: Diagnosis not present

## 2021-10-06 DIAGNOSIS — F4323 Adjustment disorder with mixed anxiety and depressed mood: Secondary | ICD-10-CM | POA: Diagnosis not present

## 2021-10-24 DIAGNOSIS — F4323 Adjustment disorder with mixed anxiety and depressed mood: Secondary | ICD-10-CM | POA: Diagnosis not present

## 2021-10-29 DIAGNOSIS — F4323 Adjustment disorder with mixed anxiety and depressed mood: Secondary | ICD-10-CM | POA: Diagnosis not present

## 2021-11-04 DIAGNOSIS — F4323 Adjustment disorder with mixed anxiety and depressed mood: Secondary | ICD-10-CM | POA: Diagnosis not present

## 2021-11-13 DIAGNOSIS — F4323 Adjustment disorder with mixed anxiety and depressed mood: Secondary | ICD-10-CM | POA: Diagnosis not present

## 2021-11-19 DIAGNOSIS — F4323 Adjustment disorder with mixed anxiety and depressed mood: Secondary | ICD-10-CM | POA: Diagnosis not present

## 2021-12-01 DIAGNOSIS — F4323 Adjustment disorder with mixed anxiety and depressed mood: Secondary | ICD-10-CM | POA: Diagnosis not present

## 2021-12-10 DIAGNOSIS — F4323 Adjustment disorder with mixed anxiety and depressed mood: Secondary | ICD-10-CM | POA: Diagnosis not present

## 2022-01-01 DIAGNOSIS — F4323 Adjustment disorder with mixed anxiety and depressed mood: Secondary | ICD-10-CM | POA: Diagnosis not present

## 2022-01-20 DIAGNOSIS — F4323 Adjustment disorder with mixed anxiety and depressed mood: Secondary | ICD-10-CM | POA: Diagnosis not present

## 2022-01-29 DIAGNOSIS — F4323 Adjustment disorder with mixed anxiety and depressed mood: Secondary | ICD-10-CM | POA: Diagnosis not present

## 2022-01-30 DIAGNOSIS — S30860A Insect bite (nonvenomous) of lower back and pelvis, initial encounter: Secondary | ICD-10-CM | POA: Diagnosis not present

## 2022-01-30 DIAGNOSIS — W57XXXA Bitten or stung by nonvenomous insect and other nonvenomous arthropods, initial encounter: Secondary | ICD-10-CM | POA: Diagnosis not present

## 2022-02-10 DIAGNOSIS — F4323 Adjustment disorder with mixed anxiety and depressed mood: Secondary | ICD-10-CM | POA: Diagnosis not present

## 2022-04-02 DIAGNOSIS — F4323 Adjustment disorder with mixed anxiety and depressed mood: Secondary | ICD-10-CM | POA: Diagnosis not present

## 2022-04-12 DIAGNOSIS — J189 Pneumonia, unspecified organism: Secondary | ICD-10-CM | POA: Diagnosis not present

## 2022-04-12 DIAGNOSIS — R051 Acute cough: Secondary | ICD-10-CM | POA: Diagnosis not present

## 2022-05-01 ENCOUNTER — Ambulatory Visit
Admission: RE | Admit: 2022-05-01 | Discharge: 2022-05-01 | Disposition: A | Discharge status: Home / Self Care | Payer: BC Managed Care – PPO | Source: Ambulatory Visit | Attending: Family Medicine | Admitting: Family Medicine

## 2022-05-01 ENCOUNTER — Other Ambulatory Visit: Payer: Self-pay | Admitting: Family Medicine

## 2022-05-01 DIAGNOSIS — R059 Cough, unspecified: Secondary | ICD-10-CM | POA: Diagnosis not present

## 2022-05-11 DIAGNOSIS — R059 Cough, unspecified: Secondary | ICD-10-CM | POA: Diagnosis not present

## 2022-07-03 DIAGNOSIS — R197 Diarrhea, unspecified: Secondary | ICD-10-CM | POA: Diagnosis not present

## 2022-07-03 DIAGNOSIS — R1013 Epigastric pain: Secondary | ICD-10-CM | POA: Diagnosis not present

## 2022-08-11 ENCOUNTER — Other Ambulatory Visit: Payer: Self-pay | Admitting: Cardiology

## 2022-09-04 DIAGNOSIS — J069 Acute upper respiratory infection, unspecified: Secondary | ICD-10-CM | POA: Diagnosis not present

## 2022-09-11 DIAGNOSIS — R059 Cough, unspecified: Secondary | ICD-10-CM | POA: Diagnosis not present

## 2022-09-21 DIAGNOSIS — Z01419 Encounter for gynecological examination (general) (routine) without abnormal findings: Secondary | ICD-10-CM | POA: Diagnosis not present

## 2022-09-21 DIAGNOSIS — Z1231 Encounter for screening mammogram for malignant neoplasm of breast: Secondary | ICD-10-CM | POA: Diagnosis not present

## 2022-09-21 DIAGNOSIS — Z1151 Encounter for screening for human papillomavirus (HPV): Secondary | ICD-10-CM | POA: Diagnosis not present

## 2022-09-21 DIAGNOSIS — Z130 Encounter for screening for diseases of the blood and blood-forming organs and certain disorders involving the immune mechanism: Secondary | ICD-10-CM | POA: Diagnosis not present

## 2022-09-21 DIAGNOSIS — Z124 Encounter for screening for malignant neoplasm of cervix: Secondary | ICD-10-CM | POA: Diagnosis not present

## 2022-10-06 DIAGNOSIS — F411 Generalized anxiety disorder: Secondary | ICD-10-CM | POA: Diagnosis not present

## 2022-10-06 DIAGNOSIS — R079 Chest pain, unspecified: Secondary | ICD-10-CM | POA: Diagnosis not present

## 2022-11-13 DIAGNOSIS — H6122 Impacted cerumen, left ear: Secondary | ICD-10-CM | POA: Diagnosis not present

## 2022-11-13 DIAGNOSIS — R599 Enlarged lymph nodes, unspecified: Secondary | ICD-10-CM | POA: Diagnosis not present

## 2022-11-23 ENCOUNTER — Encounter: Payer: Self-pay | Admitting: Gastroenterology

## 2022-11-24 ENCOUNTER — Telehealth: Payer: Self-pay

## 2022-11-24 DIAGNOSIS — R58 Hemorrhage, not elsewhere classified: Secondary | ICD-10-CM | POA: Insufficient documentation

## 2022-11-24 DIAGNOSIS — O09529 Supervision of elderly multigravida, unspecified trimester: Secondary | ICD-10-CM | POA: Insufficient documentation

## 2022-11-24 NOTE — Telephone Encounter (Signed)
Pt previsit is sched 12/02/22, pt flagged as difficult airway, please advise, thank you.

## 2022-11-25 NOTE — Telephone Encounter (Signed)
Call to pt, lm that she will need her colonoscopy to be done in a hospital setting for her safety in the event of an emergency due to her having a "difficult airway".  Let pt know Dr Lanetta Inch nurse will call her to schedule the hospital colon when an opening becomes available and that I will cancel her upcoming previsit and colonoscopy at this time.  Let her know to call the office with any questions.

## 2022-11-25 NOTE — Telephone Encounter (Signed)
Okay thanks for letting me know. Martha Mason please cancel her procedure that is scheduled for later this month at the [facility] and let her know that due to her having a difficult airway for intubation, that her case needs to be done at the hospital. I have added her to the hospital wait list but there is currently a few month wait to have a procedure there. She will be called for scheduling when an opening arises. Thanks

## 2022-11-25 NOTE — Telephone Encounter (Signed)
Inbound call from patient needing to be further advise on previous note. Pease advised.

## 2022-11-27 ENCOUNTER — Telehealth: Payer: Self-pay

## 2022-11-27 NOTE — Telephone Encounter (Signed)
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 Patient request to remain on the cancellation list in case something else comes up.

## 2022-11-27 NOTE — Telephone Encounter (Signed)
Called and LM. Dr. Adela Lank could do her colonoscopy on Thur 8-20 at 11am at [facility]. Asked her to call back as soon as possible to secure this spot. MyChart message sent to pt

## 2022-12-14 ENCOUNTER — Other Ambulatory Visit: Payer: Self-pay

## 2022-12-14 ENCOUNTER — Telehealth: Payer: Self-pay

## 2022-12-14 DIAGNOSIS — Z1211 Encounter for screening for malignant neoplasm of colon: Secondary | ICD-10-CM

## 2022-12-14 NOTE — Telephone Encounter (Signed)
Called and Left message that Dr. Adela Lank could do her screening colonoscopy on Monday 10-28 at [facility]. She has an office appointment with Mike Gip on 9-6 and can get her instructions at that time if we schedule her for 10-28. Asked patient to call back and let us know as soon as possible so we can get her on the schedule.

## 2022-12-14 NOTE — Progress Notes (Signed)
Colonoscopy at [facility] on 10-28 at 7:30am.  Case #[id number].  Patient has an appointment with Mike Gip on 9-6 and will to be instructed and prep sent at that time. Amb referral completed.

## 2022-12-14 NOTE — Telephone Encounter (Signed)
Patient called back.  She is available to have procedure on 10-28. Case scheduled at [facility] with DR. Armbruster. She will need to be instructed at appointment with Mike Gip on 9-6

## 2022-12-22 DIAGNOSIS — R102 Pelvic and perineal pain: Secondary | ICD-10-CM | POA: Diagnosis not present

## 2022-12-23 ENCOUNTER — Encounter: Payer: BC Managed Care – PPO | Admitting: Gastroenterology

## 2022-12-28 ENCOUNTER — Telehealth: Payer: Self-pay | Admitting: Gastroenterology

## 2022-12-28 DIAGNOSIS — R109 Unspecified abdominal pain: Secondary | ICD-10-CM | POA: Diagnosis not present

## 2022-12-28 NOTE — Telephone Encounter (Signed)
Informed pt that difficult airway was flagged in her chart by CRNA 01-16-16.  Understanding voiced

## 2022-12-28 NOTE — Telephone Encounter (Signed)
LMOM to call back to discuss

## 2022-12-28 NOTE — Telephone Encounter (Signed)
Patient called requesting a call back from a nurse to discuss the information below. She would like to know where the "difficult airway" information came from.

## 2023-01-06 ENCOUNTER — Telehealth: Payer: Self-pay

## 2023-01-06 NOTE — Telephone Encounter (Signed)
Called and LM for patient that Dr. Adela Lank has an opening for colonoscopy next Tuesday 8-20 at [facility].  Asked that she call or MyChart asap to let us know if she would like to take that spot. Note: Patient is not on BT or GLP1 antagonist.

## 2023-01-07 NOTE — Telephone Encounter (Signed)
Spot was taken by another patient

## 2023-01-29 ENCOUNTER — Encounter: Payer: Self-pay | Admitting: Physician Assistant

## 2023-01-29 ENCOUNTER — Ambulatory Visit (INDEPENDENT_AMBULATORY_CARE_PROVIDER_SITE_OTHER): Payer: BC Managed Care – PPO | Admitting: Physician Assistant

## 2023-01-29 ENCOUNTER — Other Ambulatory Visit (INDEPENDENT_AMBULATORY_CARE_PROVIDER_SITE_OTHER): Payer: BC Managed Care – PPO

## 2023-01-29 ENCOUNTER — Other Ambulatory Visit: Payer: Self-pay | Admitting: Physician Assistant

## 2023-01-29 DIAGNOSIS — Z1211 Encounter for screening for malignant neoplasm of colon: Secondary | ICD-10-CM | POA: Diagnosis not present

## 2023-01-29 DIAGNOSIS — R1030 Lower abdominal pain, unspecified: Secondary | ICD-10-CM | POA: Diagnosis not present

## 2023-01-29 LAB — COMPREHENSIVE METABOLIC PANEL
ALT: 17 U/L (ref 0–35)
AST: 17 U/L (ref 0–37)
Albumin: 4.3 g/dL (ref 3.5–5.2)
Alkaline Phosphatase: 69 U/L (ref 39–117)
BUN: 15 mg/dL (ref 6–23)
CO2: 27 meq/L (ref 19–32)
Calcium: 9.2 mg/dL (ref 8.4–10.5)
Chloride: 106 meq/L (ref 96–112)
Creatinine, Ser: 0.8 mg/dL (ref 0.40–1.20)
GFR: 86.39 mL/min (ref >60.00)
Glucose, Bld: 84 mg/dL (ref 70–99)
Potassium: 4 meq/L (ref 3.5–5.1)
Sodium: 140 meq/L (ref 135–145)
Total Bilirubin: 0.4 mg/dL (ref 0.2–1.2)
Total Protein: 7.2 g/dL (ref 6.0–8.3)

## 2023-01-29 LAB — CBC WITH DIFFERENTIAL/PLATELET
Basophils Absolute: 0 10*3/uL (ref 0.0–0.1)
Basophils Relative: 0.8 % (ref 0.0–3.0)
Eosinophils Absolute: 0.2 10*3/uL (ref 0.0–0.7)
Eosinophils Relative: 2.6 % (ref 0.0–5.0)
HCT: 45.2 % (ref 36.0–46.0)
Hemoglobin: 14.9 g/dL (ref 12.0–15.0)
Lymphocytes Relative: 28.5 % (ref 12.0–46.0)
Lymphs Abs: 1.8 10*3/uL (ref 0.7–4.0)
MCHC: 32.9 g/dL (ref 30.0–36.0)
MCV: 94.3 fl (ref 78.0–100.0)
Monocytes Absolute: 0.6 10*3/uL (ref 0.1–1.0)
Monocytes Relative: 10.2 % (ref 3.0–12.0)
Neutro Abs: 3.6 10*3/uL (ref 1.4–7.7)
Neutrophils Relative %: 57.9 % (ref 43.0–77.0)
Platelets: 244 10*3/uL (ref 150.0–400.0)
RBC: 4.79 Mil/uL (ref 3.87–5.11)
RDW: 12.5 % (ref 11.5–15.5)
WBC: 6.1 10*3/uL (ref 4.0–10.5)

## 2023-01-29 MED ORDER — NA SULFATE-K SULFATE-MG SULF 17.5-3.13-1.6 GM/177ML PO SOLN: 1.0000 | ORAL | 0 refills | Status: DC

## 2023-01-29 NOTE — Progress Notes (Signed)
Subjective:    Patient ID: Martha Mason, female    DOB: 12/17/1975, 47 y.o.   MRN: [id number]  HPI Martha Mason is a pleasant 47 year old white female, just established with Dr. Adela Lank after being referred for screening colonoscopy by her gynecologist due to age. She was to have colonoscopy here in July but it was flagged as difficult intubation and is now scheduled for colonoscopy on 03/22/2023 at the hospital with Dr. Adela Lank.  She comes in today because of complaints of abdominal pain.  She says she started having mild lower abdominal discomfort more on the right side around April May 2024.  This initially occurred the week after her menses, she had to wait a couple of cycles and then was seen by her gynecologist who did a pelvic ultrasound and this was negative for any findings on the right, was found to have a polyp in her uterus and a small ovarian cyst on the left.  She is to have an additional ultrasound later this fall with GYN. She is concerned because she has had persistence of symptoms, she also saw her PCP but was not offered any workup.  She was given a trial of Levsin sublingual and says that she is using this occasionally and it does help somewhat.  She has not had significant symptoms over the past couple weeks but up until that time had ongoing symptoms all summer with vague tenderness in the lower abdomen and she says when she sits she just has a sensation of fullness in the lower abdomen.  Her bowel movements have been regular she has not noticed any change in bowel habits or any melena or hematochezia.  No increase or change in symptoms postprandially no nausea or vomiting, appetite has been fine, weight has been stable.  No urinary symptoms but does have history of prior kidney stone.  The abdominal surgery was a C-section. Other issues include hypertension and palpitations.    Review of Systems Pertinent positive and negative review of systems were noted in the above  HPI section.  All other review of systems was otherwise negative.   Outpatient Encounter Medications as of 01/29/2023  Medication Sig   hyoscyamine (LEVSIN) 0.125 MG tablet Take 0.125 mg by mouth 3 (three) times daily as needed for cramping.   Na Sulfate-K Sulfate-Mg Sulf (SUPREP BOWEL PREP KIT) 17.5-3.13-1.6 GM/177ML SOLN Take 1 kit by mouth as directed.   sertraline (ZOLOFT) 50 MG tablet Take 50 mg by mouth daily.   metoprolol tartrate (LOPRESSOR) 25 MG tablet TAKE 1 TABLET (25 MG TOTAL) BY MOUTH EVERY 6 (SIX) HOURS AS NEEDED (FOR RAPID HEART RATE).   No facility-administered encounter medications on file as of 01/29/2023.   Allergies  Allergen Reactions   Hydrocodone Other (See Comments)    Skin crawls   Oxycodone     Feels like bugs crawling on her    Keflex [Cephalexin] Palpitations    Patient unsure of allergy    Tape Rash    Red splotches   Patient Active Problem List   Diagnosis Date Noted   Maternal age 74+, multigravida, antepartum 11/24/2022   Bleeding 11/24/2022   Head injuries 05/09/2021   Heart murmur 05/09/2021   Meningitis 05/09/2021   Vertigo 05/09/2021   Palpitations 12/14/2018   S/P primary low transverse C-section 10/22/2018   Mild pre-eclampsia 10/21/2018   In vitro fertilization 09/08/2018   Placental abnormality 02/04/2016   Retained products of conception, postpartum 01/16/2016   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   Retained  portions of placenta 01/16/2016   Retained placenta after delivery without hemorrhage but with other complication 12/25/2015   SVD (spontaneous vaginal delivery) 12/24/2015   Preeclampsia 12/23/2015   Pregnancy 06/21/2015   Anxiety disorder 06/14/2015   Hemorrhoids 06/14/2015   Polyp of corpus uteri 06/25/2013   Mitral valve prolapse 08/16/2012   Nephrolithiasis 08/15/2012   Hydronephrosis 08/15/2012   Flank pain 08/15/2012   Ureteric stone 08/15/2012   Social History   Socioeconomic History   Marital status: Married    Spouse  name: Not on file   Number of children: 2   Years of education: Masters   Highest education level: Not on file  Occupational History   Occupation: Runner, broadcasting/film/video   Tobacco Use   Smoking status: Never   Smokeless tobacco: Never  Vaping Use   Vaping status: Never Used  Substance and Sexual Activity   Alcohol use: No    Alcohol/week: 0.0 standard drinks of alcohol   Drug use: No   Sexual activity: Yes  Other Topics Concern   Not on file  Social History Narrative   Rare caffeine intake    Social Determinants of Health   Financial Resource Strain: Low Risk  (10/18/2018)   Overall Financial Resource Strain (CARDIA)    Difficulty of Paying Mason Expenses: Not hard at all  Food Insecurity: No Food Insecurity (10/18/2018)   Hunger Vital Sign    Worried About Running Out of Food in the Last Year: Never true    Ran Out of Food in the Last Year: Never true  Transportation Needs: Unknown (10/18/2018)   PRAPARE - Administrator, Civil Service (Medical): No    Lack of Transportation (Non-Medical): Not on file  Physical Activity: Not on file  Stress: Stress Concern Present (10/18/2018)   [employer] of Occupational Health - Occupational Stress Questionnaire    Feeling of Stress : Rather much  Social Connections: Not on file  Intimate Partner Violence: Not At Risk (10/18/2018)   Humiliation, Afraid, Rape, and Kick questionnaire    Fear of Current or Ex-Partner: No    Emotionally Abused: No    Physically Abused: No    Sexually Abused: No    Martha Mason's family history includes Alzheimer's disease in her paternal grandfather; Heart attack in her father; Lung cancer in her mother; Prostate cancer in her father; Stroke in her paternal grandmother.      Objective:    Vitals:   01/29/23 1512  BP: 118/82  Pulse: 84  SpO2: 99%    Physical Exam Well-developed well-nourished female in no acute distress.  Pleasant height, Weight, 165 BMI 24.3  HEENT; nontraumatic  normocephalic, EOMI, PE R LA, sclera anicteric. Oropharynx; not examined today Neck; supple, no JVD Cardiovascular; regular rate and rhythm with S1-S2, no murmur rub or gallop Pulmonary; Clear bilaterally Abdomen; soft, there is some mild tenderness bilateral lower quadrants, no guarding or rebound, nondistended, no palpable mass or hepatosplenomegaly, bowel sounds are active Rectal; not done today Skin; benign exam, no jaundice rash or appreciable lesions Extremities; no clubbing cyanosis or edema skin warm and dry Neuro/Psych; alert and oriented x4, grossly nonfocal mood and affect appropriate        Assessment & Plan:   #13 47 year old white female with 11-month history of lower abdominal discomfort/tenderness, initially noted more in the right lower abdomen and now feels across the lower abdomen.  Described as vague tenderness and fullness. Pelvic ultrasound per GYN per patient report a couple of months ago  did not show any findings in the right abdomen, small left ovarian cyst and a uterine polyp.  She has to follow-up there later this fall.  Etiology of the lower abdominal symptoms is not clear, she is status post C-section, no other abdominal surgeries. Rule out pelvic/lower abdominal inflammatory process, IBD, neoplasm,IBS  #2 colon cancer screening--currently scheduled for colonoscopy 03/22/2023 at the hospital/Dr. Armbruster due to flagged as difficult intubation. Average risk.  #3 hypertension #4.  History of palpitations #5.  Ureteral lithiasis.  Plan; CBC with differential, c-Met, sed rate Patient will be scheduled for CT of the abdomen and pelvis with contrast. She can continue to use Levsin sublingual every 6 hours as needed Proceed with plan for colonoscopy 03/22/2023 with Dr. Delorise Jackson.  Patient was given prep instructions etc. today.  Further recommendations pending results of labs and CT.  Tommaso Cavitt S Anandi Abramo PA-C 01/29/2023   Cc: Milus Height, PA

## 2023-01-29 NOTE — Patient Instructions (Signed)
_______________________________________________________  If your blood pressure at your visit was 140/90 or greater, please contact your primary care physician to follow up on this.  If you are age 49 or younger, your body mass index should be between 19-25. Your Body mass index is 24.37 kg/m. If this is out of the aformentioned range listed, please consider follow up with your Primary Care Provider.  ________________________________________________________  The [facility] providers would like to encourage you to use [facility] to communicate with providers for non-urgent requests or questions.  Due to long hold times on the telephone, sending your provider a message by [facility] may be a faster and more efficient way to get a response.  Please allow 48 business hours for a response.  Please remember that this is for non-urgent requests.  _______________________________________________________  Your provider has requested that you go to the basement level for lab work before leaving today. Press "B" on the elevator. The lab is located at the first door on the left as you exit the elevator.  OK to use Levsin under the tongue three times daily as needed.  You have been scheduled for a colonoscopy. Please follow written instructions given to you at your visit today.   Please pick up your prep supplies at the pharmacy within the next 1-3 days.  If you use inhalers (even only as needed), please bring them with you on the day of your procedure.  DO NOT TAKE 7 DAYS PRIOR TO TEST- Trulicity (dulaglutide) Ozempic, Wegovy (semaglutide) Mounjaro (tirzepatide) Bydureon Bcise (exanatide extended release)  DO NOT TAKE 1 DAY PRIOR TO YOUR TEST Rybelsus (semaglutide) Adlyxin (lixisenatide) Victoza (liraglutide) Byetta (exanatide) ___________________________________________________________________________  You have been scheduled for a CT scan of the abdomen and pelvis at [facility], 1st floor  Radiology. You are scheduled on 02-05-23 at 530pm. You should arrive at 315pm time for registration and to drink contrast.   Please follow the written instructions below on the day of your exam:   1) Do not eat anything after 130pm (4 hours prior to your test)   You may take any medications as prescribed with a small amount of water, if necessary. If you take any of the following medications: METFORMIN, GLUCOPHAGE, GLUCOVANCE, AVANDAMET, RIOMET, FORTAMET, ACTOPLUS MET, JANUMET, GLUMETZA or METAGLIP, you MAY be asked to HOLD this medication 48 hours AFTER the exam.   The purpose of you drinking the oral contrast is to aid in the visualization of your intestinal tract. The contrast solution may cause some diarrhea. Depending on your individual set of symptoms, you may also receive an intravenous injection of x-ray contrast/dye. Plan on being at [facility] for 45 minutes or longer, depending on the type of exam you are having performed.   If you have any questions regarding your exam or if you need to reschedule, you may call [facility] at [phone] between the hours of 8:00 am and 5:00 pm, Monday-Friday.   Thank you for entrusting me with your care and choosing [facility].  Amy Esterwood, PA-C

## 2023-01-30 NOTE — Progress Notes (Signed)
Agree with assessment and plan as outlined.  

## 2023-02-02 DIAGNOSIS — H5213 Myopia, bilateral: Secondary | ICD-10-CM | POA: Diagnosis not present

## 2023-02-05 ENCOUNTER — Ambulatory Visit (HOSPITAL_COMMUNITY)
Admission: RE | Admit: 2023-02-05 | Discharge: 2023-02-05 | Disposition: A | Discharge status: Home / Self Care | Payer: BC Managed Care – PPO | Source: Ambulatory Visit | Attending: Physician Assistant | Admitting: Physician Assistant

## 2023-02-05 DIAGNOSIS — K449 Diaphragmatic hernia without obstruction or gangrene: Secondary | ICD-10-CM | POA: Diagnosis not present

## 2023-02-05 DIAGNOSIS — K5730 Diverticulosis of large intestine without perforation or abscess without bleeding: Secondary | ICD-10-CM | POA: Diagnosis not present

## 2023-02-05 DIAGNOSIS — Z1211 Encounter for screening for malignant neoplasm of colon: Secondary | ICD-10-CM | POA: Diagnosis not present

## 2023-02-05 DIAGNOSIS — N200 Calculus of kidney: Secondary | ICD-10-CM | POA: Diagnosis not present

## 2023-02-05 DIAGNOSIS — R1030 Lower abdominal pain, unspecified: Secondary | ICD-10-CM | POA: Diagnosis not present

## 2023-02-05 MED ORDER — IOHEXOL 300 MG/ML  SOLN
100.0000 mL | Freq: Once | INTRAMUSCULAR | Status: AC | PRN
  Administered 2023-02-05: 100 mL via INTRAVENOUS

## 2023-03-08 ENCOUNTER — Encounter: Payer: Self-pay | Admitting: Gastroenterology

## 2023-03-15 ENCOUNTER — Encounter (HOSPITAL_COMMUNITY): Payer: Self-pay | Admitting: Gastroenterology

## 2023-03-15 ENCOUNTER — Other Ambulatory Visit: Payer: Self-pay

## 2023-03-15 NOTE — Progress Notes (Signed)
COVID Vaccine Completed:  Date of COVID positive in last 90 days:  No  PCP - Ma Hillock, PA Cardiologist - Norman Herrlich, MD  Chest x-ray - 05-01-22 Epic EKG -  Stress Test -  ECHO - 12-23-18 Epic Cardiac Cath -  Pacemaker/ICD device last checked: Spinal Cord Stimulator:  Bowel Prep - Yes,  pt has instructions and prep  Sleep Study - N/A CPAP -   Fasting Blood Sugar - N/A Checks Blood Sugar _____ times a day  Last dose of GLP1 agonist-  N/A GLP1 instructions:  N/A   Last dose of SGLT-2 inhibitors-  N/A SGLT-2 instructions: N/A   Blood Thinner Instructions:  N/A Aspirin Instructions: Last Dose:  Activity level:  Can go up a flight of stairs and perform activities of daily living without stopping and without symptoms of chest pain or shortness of breath.  Anesthesia review:   MVP, murmur, hx of palpitations.  Has prn Metoprolol for palpitations.  Patient denies shortness of breath, fever, cough and chest pain at PAT appointment (completed over the phone)  Patient verbalized understanding of instructions that were given to them at the PAT appointment. Patient was also instructed that they will need to review over the PAT instructions again at home before surgery.

## 2023-03-15 NOTE — Progress Notes (Signed)
Attempted to obtain medical history via telephone, unable to reach at this time. HIPAA compliant voicemail message left requesting return call to pre surgical testing department.  

## 2023-03-22 ENCOUNTER — Ambulatory Visit (HOSPITAL_COMMUNITY)
Admission: RE | Admit: 2023-03-22 | Discharge: 2023-03-22 | Disposition: A | Discharge status: Home / Self Care | Payer: BC Managed Care – PPO | Attending: Gastroenterology | Admitting: Gastroenterology

## 2023-03-22 ENCOUNTER — Encounter (HOSPITAL_COMMUNITY): Payer: Self-pay | Admitting: Gastroenterology

## 2023-03-22 ENCOUNTER — Ambulatory Visit (HOSPITAL_COMMUNITY): Payer: BC Managed Care – PPO | Admitting: Registered Nurse

## 2023-03-22 ENCOUNTER — Encounter (HOSPITAL_COMMUNITY)
Admission: RE | Disposition: A | Discharge status: Home / Self Care | Payer: Self-pay | Source: Home / Self Care | Attending: Gastroenterology

## 2023-03-22 ENCOUNTER — Other Ambulatory Visit: Payer: Self-pay

## 2023-03-22 DIAGNOSIS — K5730 Diverticulosis of large intestine without perforation or abscess without bleeding: Secondary | ICD-10-CM | POA: Diagnosis not present

## 2023-03-22 DIAGNOSIS — Z1211 Encounter for screening for malignant neoplasm of colon: Secondary | ICD-10-CM | POA: Diagnosis not present

## 2023-03-22 DIAGNOSIS — K644 Residual hemorrhoidal skin tags: Secondary | ICD-10-CM | POA: Diagnosis not present

## 2023-03-22 DIAGNOSIS — K6289 Other specified diseases of anus and rectum: Secondary | ICD-10-CM

## 2023-03-22 DIAGNOSIS — K648 Other hemorrhoids: Secondary | ICD-10-CM

## 2023-03-22 HISTORY — PX: COLONOSCOPY WITH PROPOFOL: SHX5780

## 2023-03-22 SURGERY — COLONOSCOPY WITH PROPOFOL
Anesthesia: Monitor Anesthesia Care

## 2023-03-22 MED ORDER — PROPOFOL 1000 MG/100ML IV EMUL
INTRAVENOUS | Status: AC
  Filled 2023-03-22: qty 100

## 2023-03-22 MED ORDER — PROPOFOL 500 MG/50ML IV EMUL
INTRAVENOUS | Status: DC | PRN
  Administered 2023-03-22: 140 ug/kg/min via INTRAVENOUS

## 2023-03-22 MED ORDER — SODIUM CHLORIDE 0.9 % IV SOLN
INTRAVENOUS | Status: DC | PRN
Start: 2023-03-22 — End: 2023-03-22

## 2023-03-22 MED ORDER — PROPOFOL 10 MG/ML IV BOLUS
INTRAVENOUS | Status: DC | PRN
  Administered 2023-03-22: 20 mg via INTRAVENOUS

## 2023-03-22 SURGICAL SUPPLY — 22 items
ELECT REM PT RETURN 9FT ADLT (ELECTROSURGICAL)
ELECTRODE REM PT RTRN 9FT ADLT (ELECTROSURGICAL) IMPLANT
FCP BXJMBJMB 240X2.8X (CUTTING FORCEPS)
FLOOR PAD 36X40 (MISCELLANEOUS) ×1
FORCEPS BIOP RAD 4 LRG CAP 4 (CUTTING FORCEPS) IMPLANT
FORCEPS BIOP RJ4 240 W/NDL (CUTTING FORCEPS)
FORCEPS BXJMBJMB 240X2.8X (CUTTING FORCEPS) IMPLANT
INJECTOR/SNARE I SNARE (MISCELLANEOUS) IMPLANT
LUBRICANT JELLY 4.5OZ STERILE (MISCELLANEOUS) IMPLANT
MANIFOLD NEPTUNE II (INSTRUMENTS) IMPLANT
NDL SCLEROTHERAPY 25GX240 (NEEDLE) IMPLANT
NEEDLE SCLEROTHERAPY 25GX240 (NEEDLE)
PAD FLOOR 36X40 (MISCELLANEOUS) ×1 IMPLANT
PROBE APC STR FIRE (PROBE) IMPLANT
PROBE INJECTION GOLD (MISCELLANEOUS)
PROBE INJECTION GOLD 7FR (MISCELLANEOUS) IMPLANT
SNARE ROTATE MED OVAL 20MM (MISCELLANEOUS) IMPLANT
SYR 50ML LL SCALE MARK (SYRINGE) IMPLANT
TRAP SPECIMEN MUCOUS 40CC (MISCELLANEOUS) IMPLANT
TUBING ENDO SMARTCAP PENTAX (MISCELLANEOUS) IMPLANT
TUBING IRRIGATION ENDOGATOR (MISCELLANEOUS) ×1 IMPLANT
WATER STERILE IRR 1000ML POUR (IV SOLUTION) IMPLANT

## 2023-03-22 NOTE — Op Note (Signed)
[facility] Patient Name: Martha Mason Procedure Date: 03/22/2023 MRN: [id number] Attending MD: Willaim Rayas. Adela Lank , MD, [id number] Date of Birth: September 12, 1975 CSN: [id number] Age: 47 Admit Type: Outpatient Procedure:                Colonoscopy Indications:              Screening for colorectal malignant neoplasm, This                            is the patient's first colonoscopy Providers:                Willaim Rayas. Adela Lank, MD, Doristine Mango, RN, Suzy Bouchard, RN, Rozetta Nunnery, Technician, Geoffery Lyons, Technician Referring MD:              Medicines:                Monitored Anesthesia Care Complications:            No immediate complications. Estimated blood loss:                            None. Estimated Blood Loss:     Estimated blood loss: none. Procedure:                Pre-Anesthesia Assessment:                           - Prior to the procedure, a History and Physical                            was performed, and patient medications and                            allergies were reviewed. The patient's tolerance of                            previous anesthesia was also reviewed. The risks                            and benefits of the procedure and the sedation                            options and risks were discussed with the patient.                            All questions were answered, and informed consent                            was obtained. Prior Anticoagulants: The patient has                            taken no anticoagulant  or antiplatelet agents. ASA                            Grade Assessment: II - A patient with mild systemic                            disease. After reviewing the risks and benefits,                            the patient was deemed in satisfactory condition to                            undergo the procedure.                           After obtaining  informed consent, the colonoscope                            was passed under direct vision. Throughout the                            procedure, the patient's blood pressure, pulse, and                            oxygen saturations were monitored continuously. The                            PCF-HQ190L ([id number]) Olympus colonoscope was                            introduced through the anus and advanced to the the                            terminal ileum, with identification of the                            appendiceal orifice and IC valve. The colonoscopy                            was performed without difficulty. The patient                            tolerated the procedure well. The quality of the                            bowel preparation was adequate. The terminal ileum,                            ileocecal valve, appendiceal orifice, and rectum                            were photographed. Scope In: 7:32:58 AM Scope Out: 7:53:56 AM Scope Withdrawal Time: 0 hours 11 minutes 28 seconds  Total Procedure Duration: 0 hours 20 minutes 58 seconds  Findings:      Skin tags were found on perianal exam.      The terminal ileum appeared normal.      A few small-mouthed diverticula were found in the sigmoid colon.      Anal papilla(e) were hypertrophied.      Internal hemorrhoids were found during retroflexion. The hemorrhoids       were small.      The exam was otherwise without abnormality. Impression:               - Perianal skin tags found on perianal exam.                           - The examined portion of the ileum was normal.                           - Diverticulosis in the sigmoid colon.                           - Anal papilla(e) were hypertrophied.                           - Internal hemorrhoids.                           - The examination was otherwise normal.                           - No polyps. Moderate Sedation:      No moderate sedation, case performed with  MAC Recommendation:           - Patient has a contact number available for                            emergencies. The signs and symptoms of potential                            delayed complications were discussed with the                            patient. Return to normal activities tomorrow.                            Written discharge instructions were provided to the                            patient.                           - Resume previous diet.                           - Continue present medications.                           - Repeat colonoscopy in 10 years for screening  purposes. Procedure Code(s):        --- Professional ---                           [phone], Colonoscopy, flexible; diagnostic, including                            collection of specimen(s) by brushing or washing,                            when performed (separate procedure) Diagnosis Code(s):        --- Professional ---                           Z12.11, Encounter for screening for malignant                            neoplasm of colon                           K64.8, Other hemorrhoids                           K62.89, Other specified diseases of anus and rectum                           K64.4, Residual hemorrhoidal skin tags                           K57.30, Diverticulosis of large intestine without                            perforation or abscess without bleeding CPT copyright 2022 American Medical Association. All rights reserved. The codes documented in this report are preliminary and upon coder review may  be revised to meet current compliance requirements. [name], MD 03/22/2023 7:58:41 AM This report has been signed electronically. Number of Addenda: 0

## 2023-03-22 NOTE — Anesthesia Procedure Notes (Signed)
Procedure Name: MAC Date/Time: 03/22/2023 7:26 AM  Performed by: Elisabeth Cara, CRNAPre-anesthesia Checklist: Patient identified, Emergency Drugs available, Suction available, Patient being monitored and Timeout performed Oxygen Delivery Method: Simple face mask Placement Confirmation: positive ETCO2 Dental Injury: Teeth and Oropharynx as per pre-operative assessment

## 2023-03-22 NOTE — Discharge Instructions (Signed)

## 2023-03-22 NOTE — Anesthesia Preprocedure Evaluation (Addendum)
Anesthesia Evaluation  Patient identified by MRN, date of birth, ID band Patient awake    Reviewed: Allergy & Precautions, NPO status , Patient's Chart, lab work & pertinent test results, reviewed documented beta blocker date and time   History of Anesthesia Complications (+) DIFFICULT AIRWAY and history of anesthetic complications  Airway Mallampati: III  TM Distance: >3 FB Neck ROM: Limited    Dental no notable dental hx.    Pulmonary neg sleep apnea, neg COPD, neg PE   breath sounds clear to auscultation       Cardiovascular Exercise Tolerance: Good hypertension, (-) CAD, (-) Past MI and (-) Cardiac Stents + Valvular Problems/Murmurs MVP  Rhythm:Regular Rate:Normal     Neuro/Psych neg Seizures PSYCHIATRIC DISORDERS Anxiety        GI/Hepatic ,,,(+) neg Cirrhosis        Endo/Other  neg diabetes    Renal/GU Renal disease (stone dz)     Musculoskeletal   Abdominal   Peds  Hematology   Anesthesia Other Findings   Reproductive/Obstetrics                             Anesthesia Physical Anesthesia Plan  ASA: 2  Anesthesia Plan: MAC   Post-op Pain Management:    Induction:   PONV Risk Score and Plan: 2 and Ondansetron and Propofol infusion  Airway Management Planned:   Additional Equipment:   Intra-op Plan:   Post-operative Plan:   Informed Consent: I have reviewed the patients History and Physical, chart, labs and discussed the procedure including the risks, benefits and alternatives for the proposed anesthesia with the patient or authorized representative who has indicated his/her understanding and acceptance.     Dental advisory given  Plan Discussed with: CRNA  Anesthesia Plan Comments:        Anesthesia Quick Evaluation

## 2023-03-22 NOTE — H&P (Signed)
[facility] History and Physical   Primary Care Physician:  Milus Height, [place]   Reason for Procedure:   Colon cancer screening  Plan:    colonoscopy     HPI: Martha Mason is a 47 y.o. female  here for colonoscopy screening - first time exam. Patient denies any bowel symptoms at this time. She has had some lower abdominal discomfort in recent months, ovarian cyst noted. CT negative otherwise. Hyocyamine has provided some relief and she is doing better in that regard. No family history of colon cancer known. Otherwise feels well without any cardiopulmonary symptoms.   Her case is being done at the hospital for anesthesia support - she was flagged as a difficult airway for intubation in the past.   I have discussed risks / benefits of anesthesia and endoscopic procedure with [facility] and they wish to proceed with the exams as outlined today.    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   Preeclampsia    Pregnancy induced hypertension    Retained placenta after delivery without hemorrhage but with other complication 12/25/2015   Retained products of conception, postpartum 01/16/2016   SVD (spontaneous vaginal delivery) 12/24/2015   Vertigo     Past Surgical History:  Procedure Laterality Date   CESAREAN SECTION N/A 10/22/2018   Procedure: CESAREAN SECTION;  Surgeon: Huel Cote, MD;  Location: [facility] OR[facility];  Service: Obstetrics;  Laterality: N/A;   DILATATION & CURETTAGE/HYSTEROSCOPY WITH TRUECLEAR N/A 06/30/2013   Procedure: DILATATION & HYSTEROSCOPY/POLYPECTOMY WITH TRUCLEAR;  Surgeon: Robley Fries, MD;  Location: [facility] ORS;  Service: Gynecology;  Laterality: N/A;   DILATION AND  CURETTAGE OF UTERUS N/A 12/24/2015   Procedure: DILATATION AND CURETTAGE;  Surgeon: Sherian Rein, MD;  Location: [facility];  Service: Obstetrics;  Laterality: N/A;   DILATION AND EVACUATION N/A 01/16/2016   Procedure: DILATATION AND Evacuation with  ultrasound guide;  Surgeon: Sherian Rein, MD;  Location: [facility] ORS;  Service: Gynecology;  Laterality: N/A;   NO PAST SURGERIES     RENAL ARTERY STENT Left march 2014   uterine polyp      Prior to Admission medications   Medication Sig Start Date End Date Taking? Authorizing Provider  hyoscyamine (LEVSIN) 0.125 MG tablet Take 0.125 mg by mouth 3 (three) times daily as needed for cramping. 12/28/22  Yes [provider]  metoprolol tartrate (LOPRESSOR) 25 MG tablet TAKE 1 TABLET (25 MG TOTAL) BY MOUTH EVERY 6 (SIX) HOURS AS NEEDED (FOR RAPID HEART RATE). 08/11/22 03/22/23 Yes Baldo Daub, MD  sertraline (ZOLOFT) 50 MG tablet Take 50 mg by mouth daily. 05/03/21  Yes [provider]  SUPREP BOWEL PREP KIT 17.5-3.13-1.6 GM/177ML SOLN TAKE AS DIRECTED 02/01/23  Yes Esterwood, Amy S, PA-C    No current facility-administered medications for this encounter.    Allergies as of 12/14/2022 - Review Complete 05/27/2021  Allergen Reaction Noted   Hydrocodone Other (See Comments) 08/13/2012   Oxycodone  06/30/2013   Keflex [cephalexin] Palpitations 08/13/2012   Tape Rash 12/18/2014    Family History  Problem Relation Age  of Onset   Lung cancer Mother    Prostate cancer Father    Heart attack Father    Stroke Paternal Grandmother    Alzheimer's disease Paternal Grandfather    Colon cancer Neg Hx    Rectal cancer Neg Hx    Stomach cancer Neg Hx    Esophageal cancer Neg Hx    Pancreatic cancer Neg Hx    Liver cancer Neg Hx     Social History   Socioeconomic History   Marital status: Married    Spouse name: Not on file   Number of children: 2   Years of education: Masters   Highest education level: Not on  file  Occupational History   Occupation: Runner, broadcasting/film/video   Tobacco Use   Smoking status: Never   Smokeless tobacco: Never  Vaping Use   Vaping status: Never Used  Substance and Sexual Activity   Alcohol use: No    Alcohol/week: 0.0 standard drinks of alcohol   Drug use: No   Sexual activity: Yes  Other Topics Concern   Not on file  Social History Narrative   Rare caffeine intake    Social Determinants of Health   Financial Resource Strain: Low Risk  (10/18/2018)   Overall Financial Resource Strain (CARDIA)    Difficulty of Paying Living Expenses: Not hard at all  Food Insecurity: No Food Insecurity (10/18/2018)   Hunger Vital Sign    Worried About Running Out of Food in the Last Year: Never true    Ran Out of Food in the Last Year: Never true  Transportation Needs: Unknown (10/18/2018)   PRAPARE - Administrator, Civil Service (Medical): No    Lack of Transportation (Non-Medical): Not on file  Physical Activity: Not on file  Stress: Stress Concern Present (10/18/2018)   [employer] of Occupational Health - Occupational Stress Questionnaire    Feeling of Stress : Rather much  Social Connections: Not on file  Intimate Partner Violence: Not At Risk (10/18/2018)   Humiliation, Afraid, Rape, and Kick questionnaire    Fear of Current or Ex-Partner: No    Emotionally Abused: No    Physically Abused: No    Sexually Abused: No    Review of Systems: All other review of systems negative except as mentioned in the HPI.  Physical Exam: Vital signs BP (!) 147/83   Pulse 91   Temp 97.9 F (36.6 C) (Tympanic)   Resp 20   Ht 5\' 9"  (1.753 m)   Wt 72.6 kg   LMP 03/01/2023 (Exact Date)   SpO2 100%   BMI 23.64 kg/m   General:   Alert,  Well-developed, pleasant and cooperative in NAD Lungs:  Clear throughout to auscultation.   Heart:  Regular rate and rhythm Abdomen:  Soft, nontender and nondistended.   Neuro/Psych:  Alert and cooperative. Normal mood and affect. A and  O x 3  Harlin Rain, MD [facility]

## 2023-03-22 NOTE — Anesthesia Postprocedure Evaluation (Signed)
Anesthesia Post Note  Patient: DEBBY MEDDOCK  Procedure(s) Performed: COLONOSCOPY WITH PROPOFOL     Patient location during evaluation: PACU Anesthesia Type: MAC Level of consciousness: awake and alert Pain management: pain level controlled Vital Signs Assessment: post-procedure vital signs reviewed and stable Respiratory status: spontaneous breathing, nonlabored ventilation, respiratory function stable and patient connected to nasal cannula oxygen Cardiovascular status: stable and blood pressure returned to baseline Postop Assessment: no apparent nausea or vomiting Anesthetic complications: no   No notable events documented.  Last Vitals:  Vitals:   03/22/23 0808 03/22/23 0810  BP: 132/78 124/76  Pulse: 95 96  Resp: (!) 21 18  Temp:    SpO2: 100% 100%    Last Pain:  Vitals:   03/22/23 0810  TempSrc:   PainSc: 0-No pain                 Mariann Barter

## 2023-03-22 NOTE — Transfer of Care (Signed)
Immediate Anesthesia Transfer of Care Note  Patient: Martha Mason  Procedure(s) Performed: COLONOSCOPY WITH PROPOFOL  Patient Location: PACU  Anesthesia Type:MAC  Level of Consciousness: drowsy and patient cooperative  Airway & Oxygen Therapy: Patient Spontanous Breathing and Patient connected to face mask oxygen  Post-op Assessment: Report given to RN, Post -op Vital signs reviewed and stable, and Patient moving all extremities  Post vital signs: Reviewed and stable  Last Vitals:  Vitals Value Taken Time  BP    Temp    Pulse 80 03/22/23 0800  Resp 17 03/22/23 0800  SpO2 100 % 03/22/23 0800  Vitals shown include unfiled device data.  Last Pain:  Vitals:   03/22/23 0639  TempSrc: Tympanic  PainSc: 0-No pain         Complications: No notable events documented.

## 2023-03-23 ENCOUNTER — Encounter (HOSPITAL_COMMUNITY): Payer: Self-pay | Admitting: Gastroenterology

## 2023-04-02 ENCOUNTER — Other Ambulatory Visit: Payer: Self-pay | Admitting: Cardiology

## 2023-05-10 DIAGNOSIS — L821 Other seborrheic keratosis: Secondary | ICD-10-CM | POA: Diagnosis not present

## 2023-05-10 DIAGNOSIS — L578 Other skin changes due to chronic exposure to nonionizing radiation: Secondary | ICD-10-CM | POA: Diagnosis not present

## 2023-05-10 DIAGNOSIS — Z86018 Personal history of other benign neoplasm: Secondary | ICD-10-CM | POA: Diagnosis not present

## 2023-05-10 DIAGNOSIS — L814 Other melanin hyperpigmentation: Secondary | ICD-10-CM | POA: Diagnosis not present

## 2023-05-14 IMAGING — MG DIGITAL DIAGNOSTIC BILAT W/ TOMO W/ CAD
8 of 14 series · 8 of 40 positions shown · non-contrast
Comparison: Previous exam(s).

CLINICAL DATA: Follow-up for probably benign asymmetry in the RIGHT
breast. This probably benign finding was initially identified in
Tuesday January, 2018 with recommendation for a follow-up RIGHT breast
diagnostic mammogram in 6 months. Patient returns today for the
follow-up diagnostic exam.

EXAM:
DIGITAL DIAGNOSTIC BILATERAL MAMMOGRAM WITH TOMOSYNTHESIS AND CAD
TECHNIQUE: Bilateral digital diagnostic mammography and breast tomosynthesis
was performed. The images were evaluated with computer-aided
detection.

[R MLO synth-2D (1 of 3)]
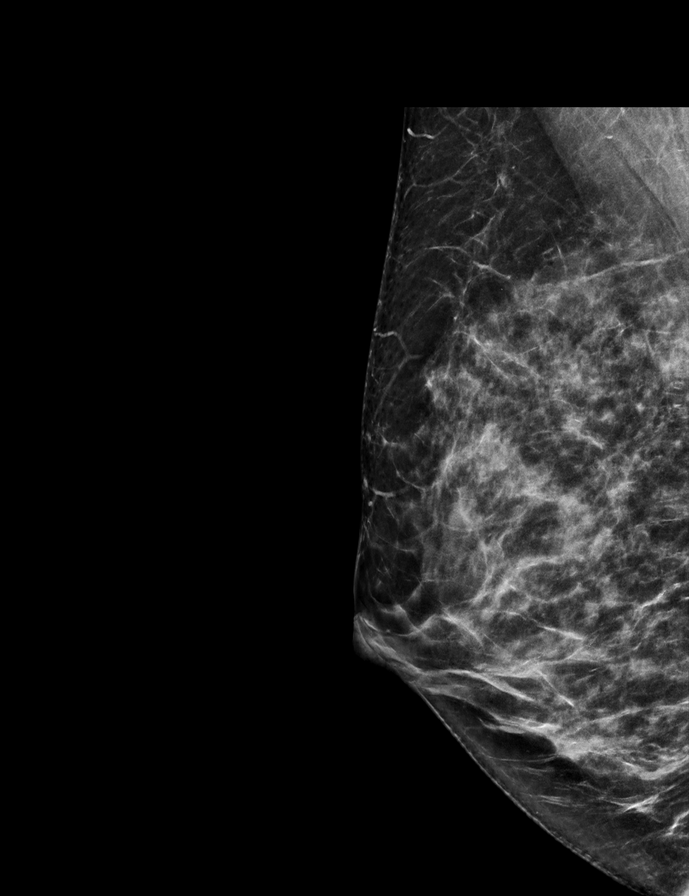

[L CC synth-2D]
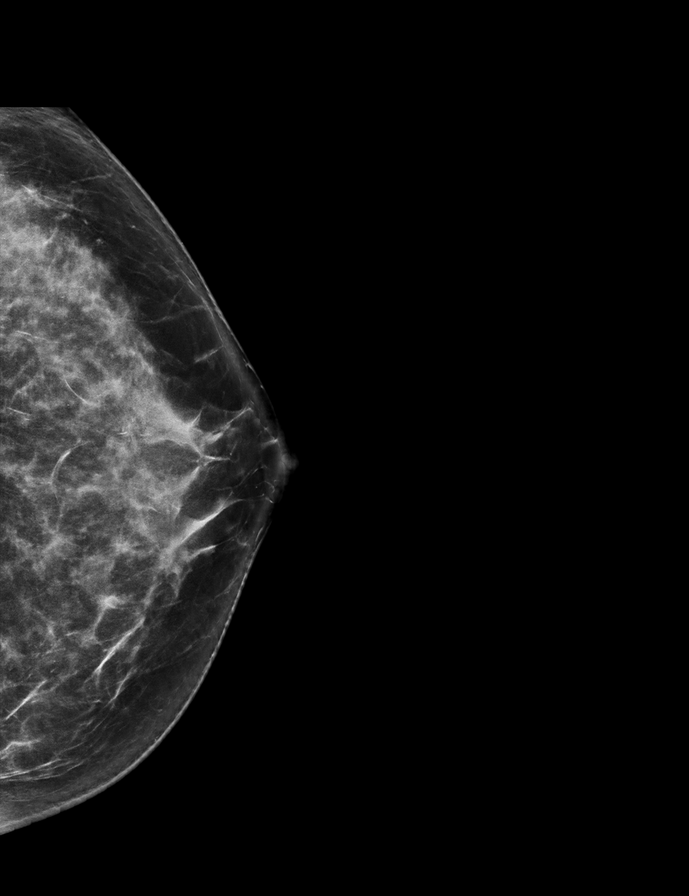

[R XCCL synth-2D]
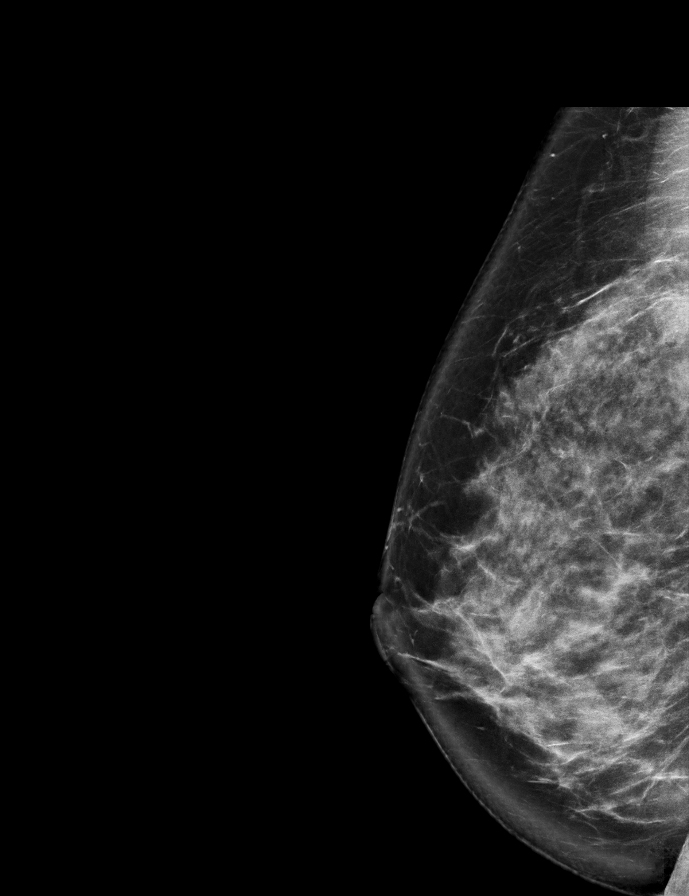

[R CC synth-2D]
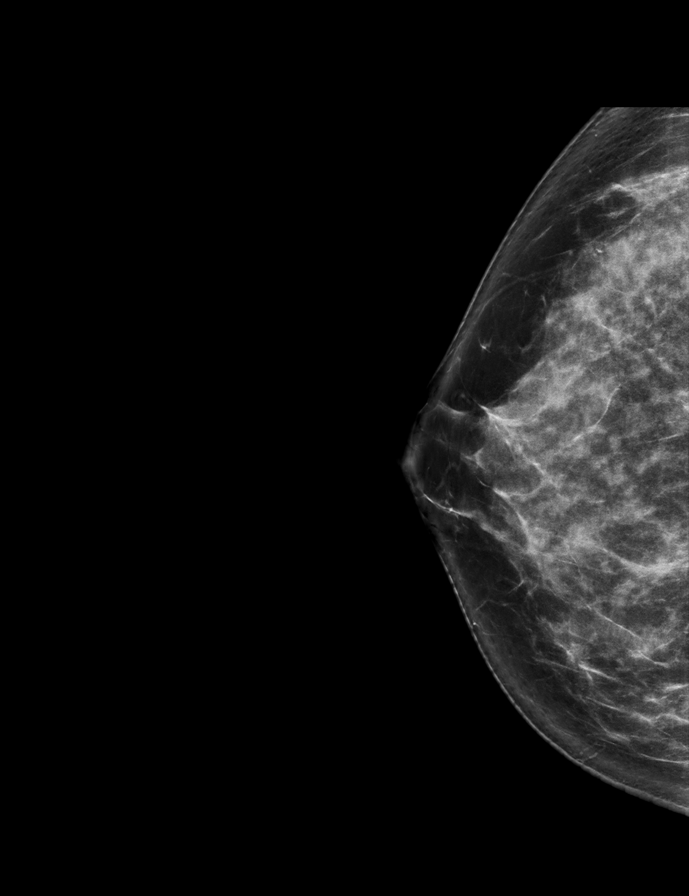

[R MLO synth-2D (2 of 3)]
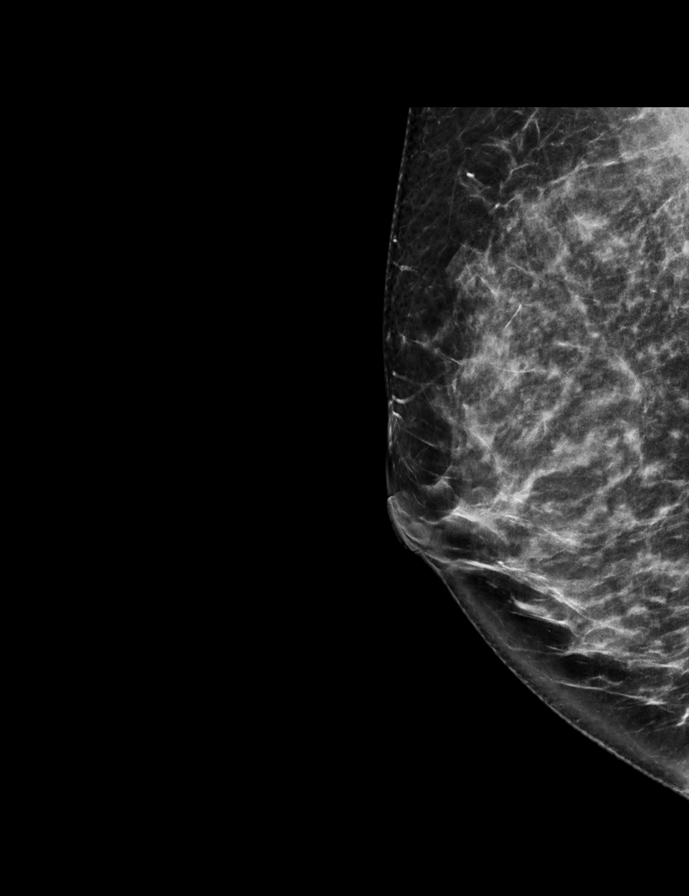

[L MLO synth-2D]
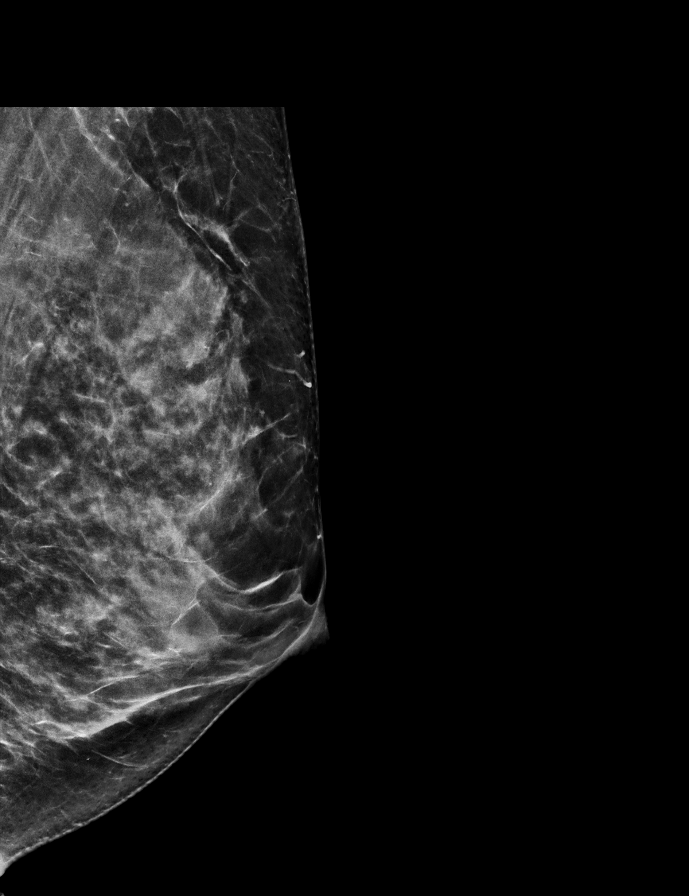

[R MLO synth-2D (3 of 3)]
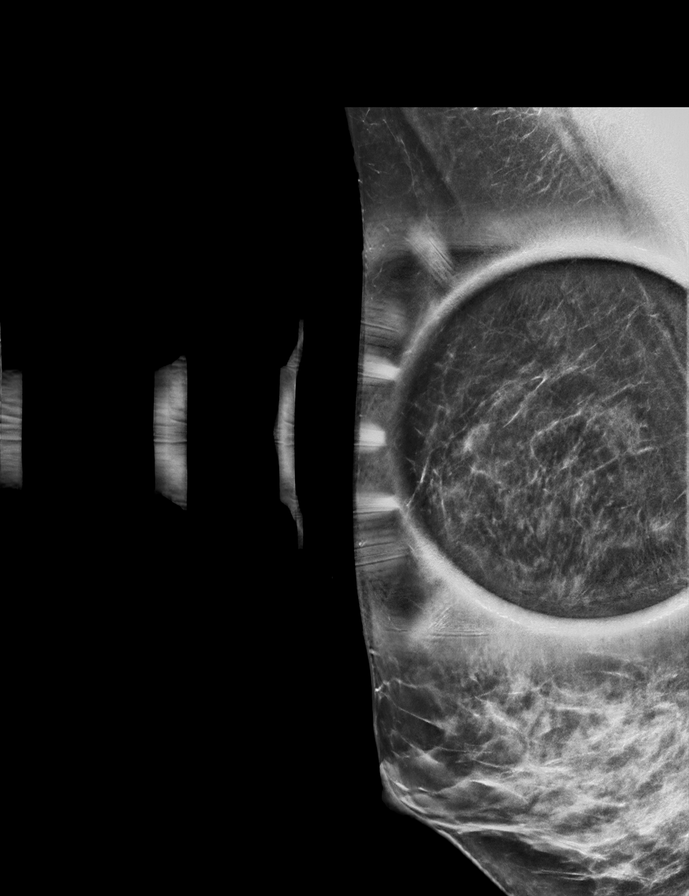

[R MLO tomo · tomo slice 37/74.0]
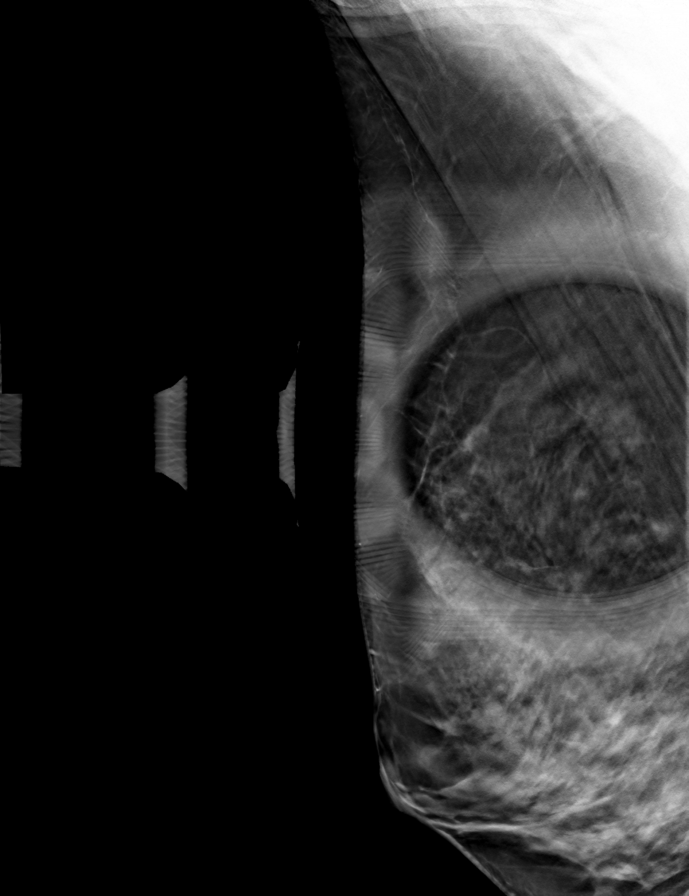

[8 of 40 positions shown; findings below may reference images not displayed]

ACR Breast Density Category c: The breast tissue is heterogeneously
dense, which may obscure small masses.
FINDINGS: The previously described asymmetry within the upper-outer quadrant
of the RIGHT breast is stable, now shown stable for greater than 3
years confirming benignity, presumably an island of normal dense
fibroglandular tissue. There are no new dominant masses, suspicious
calcifications or secondary signs of malignancy identified in either
breast on today's exam.
IMPRESSION: No evidence of malignancy within either breast.

Patient may return to routine annual bilateral screening mammogram
schedule.

RECOMMENDATION:
Screening mammogram in one year.(Code:J5-0-JD3)

I have discussed the findings and recommendations with the patient.
If applicable, a reminder letter will be sent to the patient
regarding the next appointment.

BI-RADS CATEGORY  1: Negative.

## 2023-05-15 DIAGNOSIS — M65271 Calcific tendinitis, right ankle and foot: Secondary | ICD-10-CM | POA: Diagnosis not present

## 2023-05-22 DIAGNOSIS — J189 Pneumonia, unspecified organism: Secondary | ICD-10-CM | POA: Diagnosis not present

## 2023-05-22 DIAGNOSIS — J029 Acute pharyngitis, unspecified: Secondary | ICD-10-CM | POA: Diagnosis not present

## 2023-05-22 DIAGNOSIS — K219 Gastro-esophageal reflux disease without esophagitis: Secondary | ICD-10-CM | POA: Diagnosis not present

## 2023-08-17 DIAGNOSIS — N9089 Other specified noninflammatory disorders of vulva and perineum: Secondary | ICD-10-CM | POA: Diagnosis not present

## 2023-09-28 ENCOUNTER — Other Ambulatory Visit: Payer: Self-pay | Admitting: Cardiology

## 2023-09-29 NOTE — Telephone Encounter (Signed)
 Prescription sent to pharmacy.

## 2023-10-13 DIAGNOSIS — Z130 Encounter for screening for diseases of the blood and blood-forming organs and certain disorders involving the immune mechanism: Secondary | ICD-10-CM | POA: Diagnosis not present

## 2023-10-13 DIAGNOSIS — Z01419 Encounter for gynecological examination (general) (routine) without abnormal findings: Secondary | ICD-10-CM | POA: Diagnosis not present

## 2023-10-13 DIAGNOSIS — Z1231 Encounter for screening mammogram for malignant neoplasm of breast: Secondary | ICD-10-CM | POA: Diagnosis not present

## 2023-10-13 DIAGNOSIS — R002 Palpitations: Secondary | ICD-10-CM | POA: Diagnosis not present

## 2023-10-15 DIAGNOSIS — R002 Palpitations: Secondary | ICD-10-CM | POA: Diagnosis not present

## 2023-10-15 DIAGNOSIS — F419 Anxiety disorder, unspecified: Secondary | ICD-10-CM | POA: Diagnosis not present

## 2023-10-15 DIAGNOSIS — R030 Elevated blood-pressure reading, without diagnosis of hypertension: Secondary | ICD-10-CM | POA: Diagnosis not present

## 2023-11-19 ENCOUNTER — Encounter: Payer: Self-pay | Admitting: Nurse Practitioner

## 2023-11-19 ENCOUNTER — Ambulatory Visit: Attending: Nurse Practitioner | Admitting: Nurse Practitioner

## 2023-11-19 VITALS — BP 132/76 | HR 89 | Ht 69.0 in | Wt 176.2 lb

## 2023-11-19 DIAGNOSIS — Z79899 Other long term (current) drug therapy: Secondary | ICD-10-CM

## 2023-11-19 DIAGNOSIS — R002 Palpitations: Secondary | ICD-10-CM | POA: Diagnosis not present

## 2023-11-19 DIAGNOSIS — O1400 Mild to moderate pre-eclampsia, unspecified trimester: Secondary | ICD-10-CM | POA: Diagnosis not present

## 2023-11-19 DIAGNOSIS — I340 Nonrheumatic mitral (valve) insufficiency: Secondary | ICD-10-CM | POA: Diagnosis not present

## 2023-11-19 DIAGNOSIS — I341 Nonrheumatic mitral (valve) prolapse: Secondary | ICD-10-CM | POA: Diagnosis not present

## 2023-11-19 DIAGNOSIS — Z8249 Family history of ischemic heart disease and other diseases of the circulatory system: Secondary | ICD-10-CM

## 2023-11-19 LAB — COMPREHENSIVE METABOLIC PANEL WITH GFR
ALT: 17 IU/L (ref 0–32)
AST: 20 IU/L (ref 0–40)
Albumin: 4.4 g/dL (ref 3.9–4.9)
Alkaline Phosphatase: 89 IU/L (ref 44–121)
BUN/Creatinine Ratio: 14 (ref 9–23)
BUN: 12 mg/dL (ref 6–24)
Bilirubin Total: 0.4 mg/dL (ref 0.0–1.2)
CO2: 20 mmol/L (ref 20–29)
Calcium: 8.8 mg/dL (ref 8.7–10.2)
Chloride: 105 mmol/L (ref 96–106)
Creatinine, Ser: 0.83 mg/dL (ref 0.57–1.00)
Globulin, Total: 2.1 g/dL (ref 1.5–4.5)
Glucose: 81 mg/dL (ref 70–99)
Potassium: 4.7 mmol/L (ref 3.5–5.2)
Sodium: 141 mmol/L (ref 134–144)
Total Protein: 6.5 g/dL (ref 6.0–8.5)
eGFR: 86 mL/min/{1.73_m2} (ref >59)

## 2023-11-19 LAB — LIPID PANEL
Chol/HDL Ratio: 4.3 ratio (ref 0.0–4.4)
Cholesterol, Total: 217 mg/dL — ABNORMAL HIGH (ref 100–199)
HDL: 51 mg/dL (ref >39)
LDL Chol Calc (NIH): 145 mg/dL — ABNORMAL HIGH (ref 0–99)
Triglycerides: 119 mg/dL (ref 0–149)
VLDL Cholesterol Cal: 21 mg/dL (ref 5–40)

## 2023-11-19 MED ORDER — METOPROLOL TARTRATE 25 MG PO TABS: 25.0000 mg | ORAL_TABLET | Freq: Four times a day (QID) | ORAL | 3 refills | Status: AC | PRN

## 2023-11-19 NOTE — Progress Notes (Unsigned)
 Office Visit    Patient Name: Martha Mason Date of Encounter: 11/19/2023  Primary Care Provider:  Redmon, Noelle, PA Primary Cardiologist:  Redell Leiter, MD  Chief Complaint    48 year old female with a history of palpitations, pregnancy-induced hypertension, mitral valve prolapse/mitral valve regurgitation, diverticulosis, and anxiety who presents for follow-up related to palpitations and hypertension.  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   Preeclampsia    Pregnancy induced hypertension    Retained placenta after delivery without hemorrhage but with other complication 12/25/2015   Retained products of conception, postpartum 01/16/2016   SVD (spontaneous vaginal delivery) 12/24/2015   Vertigo    Past Surgical History:  Procedure Laterality Date   CESAREAN SECTION N/A 10/22/2018   Procedure: CESAREAN SECTION;  Surgeon: Estelle Service, MD;  Location: [facility];  Service: Obstetrics;  Laterality: N/A;   COLONOSCOPY WITH PROPOFOL  N/A 03/22/2023   Procedure: COLONOSCOPY WITH PROPOFOL ;  Surgeon: Leigh Elspeth SQUIBB, MD;  Location: [facility];  Service: Gastroenterology;  Laterality: N/A;   DILATATION & CURETTAGE/HYSTEROSCOPY WITH TRUECLEAR N/A 06/30/2013   Procedure: DILATATION & HYSTEROSCOPY/POLYPECTOMY WITH TRUCLEAR;  Surgeon: Robbi JONELLE Render, MD;  Location: [facility];  Service: Gynecology;  Laterality: N/A;   DILATION AND CURETTAGE OF UTERUS N/A 12/24/2015   Procedure: DILATATION AND CURETTAGE;  Surgeon: Ezzie Buba, MD;  [facility]: [facility];  Service: Obstetrics;  Laterality: N/A;   DILATION AND EVACUATION N/A 01/16/2016   Procedure: DILATATION AND Evacuation  with  ultrasound guide;  Surgeon: Ezzie Buba, MD;  Location: [facility];  Service: Gynecology;  Laterality: N/A;   NO PAST SURGERIES     RENAL ARTERY STENT Left march 2014   uterine polyp      Allergies  Allergies  Allergen Reactions   Hydrocodone Other (See Comments)    Skin crawls   Oxycodone      Feels like bugs crawling on her    Keflex [Cephalexin] Palpitations    Patient unsure of allergy    Tape Rash    Red splotches     Labs/Other Studies Reviewed    The following studies were reviewed today:  Cardiac Studies & Procedures   ______________________________________________________________________________________________     ECHOCARDIOGRAM  ECHOCARDIOGRAM COMPLETE 12/23/2018  Narrative ECHOCARDIOGRAM REPORT    Patient Name:   [name] Date of Exam: 12/23/2018 Medical Rec #:  [id number]         Height:       69.0 in Accession #:    [id number]        Weight:       154.0 lb Date of Birth:  29-Apr-1976         BSA:          1.85 m Patient Age:    43 years          BP:           110/66 mmHg Patient Gender: F                 HR:  68 bpm. Exam Location:  Outpatient   Procedure: 2D Echo and Strain Analysis  Indications:    Mitral Valve Disorder 424.0 / I05.9  History:        Patient has prior history of Echocardiogram examinations. Mitral Valve Prolapse. History of pregnancy induced hypertension eight weeks after delivery.  Sonographer:    Annabella Fell RDCS Referring Phys: [id number] BRIAN J MUNLEY  IMPRESSIONS   1. The left ventricle has normal systolic function with an ejection fraction of 60-65%. The cavity size was normal. Left ventricular diastolic parameters were normal. 2. The right ventricle has normal systolic function. The cavity was normal. There is no increase in right ventricular wall thickness. 3. The aorta is normal in size and structure.  FINDINGS Left Ventricle: The left ventricle has normal systolic function, with an  ejection fraction of 60-65%. The cavity size was normal. There is no increase in left ventricular wall thickness. Left ventricular diastolic parameters were normal.  Right Ventricle: The right ventricle has normal systolic function. The cavity was normal. There is no increase in right ventricular wall thickness.  Left Atrium: Left atrial size was normal in size.  Right Atrium: Right atrial size was normal in size. Right atrial pressure is estimated at 3 mmHg.  Interatrial Septum: No atrial level shunt detected by color flow Doppler.  Pericardium: There is no evidence of pericardial effusion.  Mitral Valve: The mitral valve is normal in structure. Mitral valve regurgitation is trivial by color flow Doppler.  Tricuspid Valve: The tricuspid valve is normal in structure. Tricuspid valve regurgitation was not visualized by color flow Doppler.  Aortic Valve: The aortic valve is normal in structure. Aortic valve regurgitation was not assessed by color flow Doppler.  Pulmonic Valve: The pulmonic valve was normal in structure. Pulmonic valve regurgitation was not assessed by color flow Doppler.  Aorta: The aorta is normal in size and structure.  Venous: The inferior vena cava is normal in size with greater than 50% respiratory variability.   +--------------+-------++ LEFT VENTRICLE        +----------------+----------++ +--------------+-------++ Diastology                 PLAX 2D               +----------------+----------++ +--------------+-------++ LV e' lateral:  13.30 cm/s LVIDd:        4.55 cm +----------------+----------++ +--------------+-------++ LV E/e' lateral:4.8        LVIDs:        3.13 cm +----------------+----------++ +--------------+-------++ LV e' medial:   9.46 cm/s  LV PW:        0.81 cm +----------------+----------++ +--------------+-------++ LV E/e' medial: 6.8        LV IVS:       0.87 cm  +----------------+----------++ +--------------+-------++ LV SV:        56 ml   +--------------+-------++ LV SV Index:  30.37   +--------------+-------++                       +--------------+-------++  +---------------+----------++ RIGHT VENTRICLE           +---------------+----------++ RV S prime:    13.40 cm/s +---------------+----------++ TAPSE (M-mode):1.8 cm     +---------------+----------++  +-------------+-------++-----------++ LEFT ATRIUM         Index       +-------------+-------++-----------++ LA diam:     3.50 cm1.89 cm/m  +-------------+-------++-----------++ LA Vol (A4C):25.5 ml13.79 ml/m +-------------+-------++-----------++ +------------+---------++-----------++ RIGHT ATRIUM  Index       +------------+---------++-----------++ RA Area:    13.00 cm            +------------+---------++-----------++ RA Volume:  27.80 ml 15.04 ml/m +------------+---------++-----------++ +------------+-----------++ AORTIC VALVE            +------------+-----------++ LVOT Vmax:  121.00 cm/s +------------+-----------++ LVOT Vmean: 77.000 cm/s +------------+-----------++ LVOT VTI:   0.214 m     +------------+-----------++  +-------------+-------++ AORTA                +-------------+-------++ Ao Root diam:2.40 cm +-------------+-------++ Ao Asc diam: 2.80 cm +-------------+-------++  +--------------+----------++ +---------------+-----------++ MITRAL VALVE             TRICUSPID VALVE            +--------------+----------++ +---------------+-----------++ MV Area (PHT):3.91 cm   TR Peak grad:  13.4 mmHg   +--------------+----------++ +---------------+-----------++ MV PHT:       56.26 msec TR Vmax:       183.00 cm/s +--------------+----------++ +---------------+-----------++ MV Decel Time:194 msec   +--------------+----------++  +-------------+------+ +--------------+----------++ SHUNTS              MV E velocity:64.00 cm/s +-------------+------+ +--------------+----------++ Systemic VTI:0.21 m MV A velocity:67.30 cm/s +-------------+------+ +--------------+----------++ MV E/A ratio: 0.95       +--------------+----------++   Lamar Fitch MD Electronically signed by Lamar Fitch MD Signature Date/Time: 12/23/2018/12:28:11 PM    Final          ______________________________________________________________________________________________     Recent Labs: 01/29/2023: ALT 17; BUN 15; Creatinine, Ser 0.80; Hemoglobin 14.9; Platelets 244.0; Potassium 4.0; Sodium 140  Recent Lipid Panel No results found for: CHOL, TRIG, HDL, CHOLHDL, VLDL, LDLCALC, LDLDIRECT  History of Present Illness    48 year old female with the above past medical history including palpitations, pregnancy-induced hypertension, mitral valve prolapse/mitral valve regurgitation, diverticulosis, and anxiety.  She has a history of pregnancy-induced hypertension/preeclampsia (last delivery was in 10/2018).  She was referred to cardiology in 11/2018 in the setting of palpitations. Echocardiogram in 11/2018 showed EF 60 to 65%, normal LV function, no RWMA, RV systolic function, trivial mitral valve regurgitation, otherwise, no significant valvular disease.  She was last seen in the office on 05/27/2021 and was stable from a cardiac standpoint.  Palpitations were noted to be primarily associated with stress, managed with as needed metoprolol .  BP was stable.  She presents today for follow-up.  Since her last visit she has been stable from a cardiac standpoint.  She works as a Clinical biochemist.  She reports that her job is very stressful at times.  She experienced an increase in palpitations towards the end of her school year. Symptoms included racing heartbeat, transient blackout, with associated  tightness in her chest and neck.  Symptoms resolved spontaneously or with as needed metoprolol .  She denies any dizziness, presyncope or syncope. She only experiences palpitations in the setting of stress/anxiety, with episodes occurring only 1-2 times a month.  She does have an Scientist, physiological, she has not had any notification of significant arrhythmia. She denies any exertional chest pain.  Since school has been out for the summer, her symptoms have improved.   Home Medications    Current Outpatient Medications  Medication Sig Dispense Refill   metoprolol  tartrate (LOPRESSOR ) 25 MG tablet Take 1 tablet (25 mg total) by mouth every 6 (six) hours as needed (For Rapid Heart Rate). Patient needs to make an appointment for further refills. 2nd attempt. 15 tablet 0   sertraline  (ZOLOFT ) 50  MG tablet Take 50 mg by mouth daily.     hyoscyamine (LEVSIN) 0.125 MG tablet Take 0.125 mg by mouth 3 (three) times daily as needed for cramping.     SUPREP BOWEL PREP KIT 17.5-3.13-1.6 GM/177ML SOLN TAKE AS DIRECTED 354 mL 0   No current facility-administered medications for this visit.     Review of Systems    She denies chest pain, dyspnea, pnd, orthopnea, n, v, dizziness, syncope, edema, weight gain, or early satiety. All other systems reviewed and are otherwise negative except as noted above.   Physical Exam    VS:  BP 132/76   Pulse 89   Ht 5' 9 (1.753 m)   Wt 176 lb 3.2 oz (79.9 kg)   SpO2 97%   BMI 26.02 kg/m  GEN: Well nourished, well developed, in no acute distress. HEENT: normal. Neck: Supple, no JVD, carotid bruits, or masses. Cardiac: RRR, no murmurs, rubs, or gallops. No clubbing, cyanosis, edema.  Radials/DP/PT 2+ and equal bilaterally.  Respiratory:  Respirations regular and unlabored, clear to auscultation bilaterally. GI: Soft, nontender, nondistended, BS + x 4. MS: no deformity or atrophy. Skin: warm and dry, no rash. Neuro:  Strength and sensation are intact. Psych: Normal  affect.  Accessory Clinical Findings    ECG personally reviewed by me today - EKG Interpretation Date/Time:  Friday November 19 2023 09:09:10 EDT Ventricular Rate:  95 PR Interval:  134 QRS Duration:  82 QT Interval:  396 QTC Calculation: 497 R Axis:   71  Text Interpretation: Normal sinus rhythm Right atrial enlargement Nonspecific ST abnormality Prolonged QT Confirmed by Daneen Perkins ([id number]) on 11/19/2023 9:13:00 AM  - no acute changes.   Lab Results  Component Value Date   WBC 6.1 01/29/2023   HGB 14.9 01/29/2023   HCT 45.2 01/29/2023   MCV 94.3 01/29/2023   PLT 244.0 01/29/2023   Lab Results  Component Value Date   CREATININE 0.80 01/29/2023   BUN 15 01/29/2023   NA 140 01/29/2023   K 4.0 01/29/2023   CL 106 01/29/2023   CO2 27 01/29/2023   Lab Results  Component Value Date   ALT 17 01/29/2023   AST 17 01/29/2023   ALKPHOS 69 01/29/2023   BILITOT 0.4 01/29/2023   No results found for: CHOL, HDL, LDLCALC, LDLDIRECT, TRIG, CHOLHDL  No results found for: HGBA1C  Assessment & Plan    1. Palpitations: History of palpitations. Symptoms include racing heartbeat, transient blackout, with associated tightness in her chest and neck.  Symptoms resolve spontaneously or with as needed metoprolol .  She denies any dizziness, presyncope or syncope. She only experiences palpitations in the setting of stress/anxiety, with episodes occurring only 1-2 times a month.  She does have an Scientist, physiological, she has not had any notification of significant arrhythmia. She denies any exertional chest pain. Since school has been out for the summer, her symptoms have improved.  Given symptoms occur only in the setting of increased stress/anxiety, we discussed possible escalation of antianxiety medication (she takes Zoloft ). Advised her to discuss this with her PCP. Continue to monitor symptoms. Reviewed ED precautions.  Continue metoprolol  as needed.  2. Pregnancy-induced hypertension: BP  has been stable overall.  She is not on any antihypertensive medication at this time.  Continue to monitor BP and report because of the greater than 130/80.  3. Mitral valve prolapse/mitral valve regurgitation: She has a reported history of mitral valve prolapse. Echocardiogram in 11/2018 showed EF 60 to 65%, normal  LV function, no RWMA, RV systolic function, trivial mitral valve regurgitation, otherwise, no significant valvular disease.  Will update echocardiogram for routine monitoring given it has been greater than 5 years since her last echo.  4. Family history of CAD: She shares that her father had a heart attack in his early 18s. She has not had a fasting lipid profile.  Will update fasting lipids, CMET.  We discussed possible coronary CTA given family history of CAD, she prefers to wait.   5. Disposition: Follow-up in 6 months, sooner if needed.      Damien JAYSON Braver, NP 11/19/2023, 9:13 AM

## 2023-11-19 NOTE — Patient Instructions (Signed)
 Medication Instructions:  Your physician recommends that you continue on your current medications as directed. Please refer to the Current Medication list given to you today.  *If you need a refill on your cardiac medications before your next appointment, please call your pharmacy*  Lab Work: Fasting lipid panel & CMET at your convenience.  Testing/Procedures: Your physician has requested that you have an echocardiogram. Echocardiography is a painless test that uses sound waves to create images of your heart. It provides your doctor with information about the size and shape of your heart and how well your heart's chambers and valves are working. This procedure takes approximately one hour. There are no restrictions for this procedure. Please do NOT wear cologne, perfume, aftershave, or lotions (deodorant is allowed). Please arrive 15 minutes prior to your appointment time.  Please note: We ask at that you not bring children with you during ultrasound (echo/ vascular) testing. Due to room size and safety concerns, children are not allowed in the ultrasound rooms during exams. Our front office staff cannot provide observation of children in our lobby area while testing is being conducted. An adult accompanying a patient to their appointment will only be allowed in the ultrasound room at the discretion of the ultrasound technician under special circumstances. We apologize for any inconvenience.   Follow-Up: At [facility], you and your health needs are our priority.  As part of our continuing mission to provide you with exceptional heart care, our providers are all part of one team.  This team includes your primary Cardiologist (physician) and Advanced Practice Providers or APPs (Physician Assistants and Nurse Practitioners) who all work together to provide you with the care you need, when you need it.  Your next appointment:   6 month(s)  Provider:   Redell Leiter, MD or Damien Braver, NP           We recommend signing up for the patient portal called MyChart.  Sign up information is provided on this After Visit Summary.  MyChart is used to connect with patients for Virtual Visits (Telemedicine).  Patients are able to view lab/test results, encounter notes, upcoming appointments, etc.  Non-urgent messages can be sent to your provider as well.   To learn more about what you can do with MyChart, go to [email or web address].

## 2023-11-21 ENCOUNTER — Encounter: Payer: Self-pay | Admitting: Nurse Practitioner

## 2023-11-22 ENCOUNTER — Ambulatory Visit: Payer: Self-pay | Admitting: Nurse Practitioner

## 2023-11-22 ENCOUNTER — Other Ambulatory Visit: Payer: Self-pay

## 2023-11-22 DIAGNOSIS — Z79899 Other long term (current) drug therapy: Secondary | ICD-10-CM

## 2023-11-22 MED ORDER — ROSUVASTATIN CALCIUM 20 MG PO TABS
20.0000 mg | ORAL_TABLET | Freq: Every day | ORAL | 3 refills | Status: DC
Start: 2023-11-22 — End: 2024-02-21

## 2023-11-22 NOTE — Telephone Encounter (Signed)
 Discussed lab results with pt. Pt was notified of lab results and recommendations of lipid lowering therapy. Pt has agreed to start Crestor 20 mg daily and repeat lab work in 6-8 weeks. Medication sent to pts pharmacy and lab orders were placed.

## 2023-12-16 DIAGNOSIS — R9389 Abnormal findings on diagnostic imaging of other specified body structures: Secondary | ICD-10-CM | POA: Diagnosis not present

## 2023-12-24 DIAGNOSIS — Z3189 Encounter for other procreative management: Secondary | ICD-10-CM | POA: Diagnosis not present

## 2024-01-07 ENCOUNTER — Ambulatory Visit (HOSPITAL_COMMUNITY)
Admission: RE | Admit: 2024-01-07 | Discharge: 2024-01-07 | Disposition: A | Discharge status: Home / Self Care | Source: Ambulatory Visit | Attending: Nurse Practitioner | Admitting: Nurse Practitioner

## 2024-01-07 DIAGNOSIS — I349 Nonrheumatic mitral valve disorder, unspecified: Secondary | ICD-10-CM

## 2024-01-07 DIAGNOSIS — O1400 Mild to moderate pre-eclampsia, unspecified trimester: Secondary | ICD-10-CM

## 2024-01-07 DIAGNOSIS — R011 Cardiac murmur, unspecified: Secondary | ICD-10-CM | POA: Diagnosis not present

## 2024-01-07 DIAGNOSIS — Z79899 Other long term (current) drug therapy: Secondary | ICD-10-CM | POA: Diagnosis not present

## 2024-01-07 DIAGNOSIS — R002 Palpitations: Secondary | ICD-10-CM | POA: Insufficient documentation

## 2024-01-07 DIAGNOSIS — Z3A00 Weeks of gestation of pregnancy not specified: Secondary | ICD-10-CM

## 2024-01-07 LAB — ECHOCARDIOGRAM COMPLETE
Area-P 1/2: 4.67 cm2
MV M vel: 6.18 m/s
MV Peak grad: 152.8 mmHg
S' Lateral: 2.8 cm

## 2024-01-21 NOTE — Telephone Encounter (Signed)
 Letter mailed to pt.

## 2024-02-15 LAB — HEPATIC FUNCTION PANEL
ALT: 20 IU/L (ref 0–32)
AST: 18 IU/L (ref 0–40)
Albumin: 4.4 g/dL (ref 3.9–4.9)
Alkaline Phosphatase: 89 IU/L (ref 41–116)
Bilirubin Total: 0.7 mg/dL (ref 0.0–1.2)
Bilirubin, Direct: 0.21 mg/dL (ref 0.00–0.40)
Total Protein: 6.6 g/dL (ref 6.0–8.5)

## 2024-02-15 LAB — LIPID PANEL
Chol/HDL Ratio: 3.3 ratio (ref 0.0–4.4)
Cholesterol, Total: 150 mg/dL (ref 100–199)
HDL: 45 mg/dL (ref >39)
LDL Chol Calc (NIH): 90 mg/dL (ref 0–99)
Triglycerides: 77 mg/dL (ref 0–149)
VLDL Cholesterol Cal: 15 mg/dL (ref 5–40)

## 2024-02-18 ENCOUNTER — Ambulatory Visit: Payer: Self-pay | Admitting: Nurse Practitioner

## 2024-02-19 ENCOUNTER — Other Ambulatory Visit: Payer: Self-pay | Admitting: Nurse Practitioner

## 2024-02-22 ENCOUNTER — Encounter (INDEPENDENT_AMBULATORY_CARE_PROVIDER_SITE_OTHER): Payer: Self-pay | Admitting: Otolaryngology

## 2024-02-22 ENCOUNTER — Ambulatory Visit (INDEPENDENT_AMBULATORY_CARE_PROVIDER_SITE_OTHER): Admitting: Otolaryngology

## 2024-02-22 VITALS — BP 175/79 | HR 82 | Ht 69.0 in | Wt 170.0 lb

## 2024-02-22 DIAGNOSIS — M270 Developmental disorders of jaws: Secondary | ICD-10-CM | POA: Diagnosis not present

## 2024-02-22 DIAGNOSIS — M279 Disease of jaws, unspecified: Secondary | ICD-10-CM | POA: Diagnosis not present

## 2024-02-22 NOTE — Progress Notes (Signed)
 Dear Dr. Alvera, Here is my assessment for our mutual patient, Martha Mason. Thank you for allowing me the opportunity to care for your patient. Please do not hesitate to contact me should you have any other questions. Sincerely, Dr. Eldora Blanch  Otolaryngology Clinic Note Referring provider: Dr. Alvera HPI:  Martha Mason is a 48 y.o. female kindly referred by Dr. Alvera for evaluation of mandibular lesion.  Initial visit (01/2024):  She got an routine Panoramic XR back in March 2025, and dentist recommend evaluation for it. PCP looked at it, and sent here for referral. She is currently using Suresmile. She thinks it was on the right side. May have slight discomfort right parasymphysis area but this is inconsistent/rare. Patient otherwise denies: - dysphagia, odynophagia, unintentional weight loss - changes in voice, shortness of breath, hemoptysis - ear pain, neck masses, no dental numbness or odontalgia, loose teeth, no FOM numbness.   ENT Surgery: no Personal or FHx of bleeding dz or anesthesia difficulty: no  AP/AC: no  Tobacco: no  PMHx: Palpitations, MVP/MVR, GAD  Independent Review of Additional Tests or Records:  CMP 11/19/2023: BUN/Cr wnl, Alk Phos 89, Ca 8.8 CBC 01/29/2023: WBC 6.1, Plt 244 Martha Mason Referral notes reviewed and uploaded or available in chart in media tab (12/17/2023): noted mandibular lesion on panoramic XR in March, rec referral; Dx: Jaw lesion; Rx: ref to ENT PMH/Meds/All/SocHx/FamHx/ROS:   Past Medical History:  Diagnosis Date   Anxiety    Endometrial polyp 06/30/2013   Head injuries    Closed head injury    Heart murmur    Kidney stone    Kidney stones    Meningitis    At age 61   Mitral valve prolapse    MVP (mitral valve prolapse)    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   Preeclampsia    Pregnancy induced hypertension    Retained placenta after delivery without hemorrhage but with other complication  12/25/2015   Retained products of conception, postpartum 01/16/2016   SVD (spontaneous vaginal delivery) 12/24/2015   Vertigo      Past Surgical History:  Procedure Laterality Date   CESAREAN SECTION N/A 10/22/2018   Procedure: CESAREAN SECTION;  Surgeon: Estelle Service, MD;  Location: [facility];  Service: Obstetrics;  Laterality: N/A;   COLONOSCOPY WITH PROPOFOL  N/A 03/22/2023   Procedure: COLONOSCOPY WITH PROPOFOL ;  Surgeon: Leigh Elspeth SQUIBB, MD;  Location: [facility];  Service: Gastroenterology;  Laterality: N/A;   DILATATION & CURETTAGE/HYSTEROSCOPY WITH TRUECLEAR N/A 06/30/2013   Procedure: DILATATION & HYSTEROSCOPY/POLYPECTOMY WITH TRUCLEAR;  Surgeon: Robbi JONELLE Render, MD;  Location: [facility];  Service: Gynecology;  Laterality: N/A;   DILATION AND CURETTAGE OF UTERUS N/A 12/24/2015   Procedure: DILATATION AND CURETTAGE;  Surgeon: Ezzie Buba, MD;  [facility];  Service: Obstetrics;  Laterality: N/A;   DILATION AND EVACUATION N/A 01/16/2016   Procedure: DILATATION AND Evacuation with  ultrasound guide;  Surgeon: Ezzie Buba, MD;  Location: [facility];  Service: Gynecology;  Laterality: N/A;   NO PAST SURGERIES     RENAL ARTERY STENT Left march 2014   uterine polyp      Family History  Problem Relation Age of Onset   Lung cancer Mother    Prostate cancer Father    Heart attack Father    Stroke Paternal Grandmother    Alzheimer's disease Paternal Grandfather    Colon cancer Neg Hx    Rectal cancer Neg Hx  Stomach cancer Neg Hx    Esophageal cancer Neg Hx    Pancreatic cancer Neg Hx    Liver cancer Neg Hx      Social Connections: Not on file      Current Outpatient Medications:    hyoscyamine (LEVSIN) 0.125 MG tablet, Take 0.125 mg by mouth 3 (three) times daily as needed for cramping., Disp: , Rfl:    metoprolol  tartrate (LOPRESSOR ) 25 MG tablet, Take 1 tablet (25 mg total) by mouth every 6 (six) hours as needed (For Rapid Heart Rate).  Patient needs to make an appointment for further refills. 2nd attempt., Disp: 112 tablet, Rfl: 3   rosuvastatin  (CRESTOR ) 20 MG tablet, TAKE 1 TABLET BY MOUTH EVERY DAY, Disp: 90 tablet, Rfl: 2   sertraline  (ZOLOFT ) 50 MG tablet, Take 50 mg by mouth daily., Disp: , Rfl:    SUPREP BOWEL PREP KIT 17.5-3.13-1.6 GM/177ML SOLN, TAKE AS DIRECTED, Disp: 354 mL, Rfl: 0   Physical Exam:   BP (!) 175/79 (BP Location: Right Arm, Patient Position: Sitting, Cuff Size: Normal)   Pulse 82   Ht 5' 9 (1.753 m)   Wt 170 lb (77.1 kg)   SpO2 95%   BMI 25.10 kg/m   Salient findings:  CN II-XII intact Bilateral EAC clear and TM intact with well pneumatized middle ear spaces Anterior rhinoscopy: Septum intact; bilateral inferior turbinates without significant hypertrophy No lesions of oral cavity/oropharynx; dentition good, no loose teeth; no oral numbness include FOM No obviously palpable neck masses/lymphadenopathy/thyromegaly No respiratory distress or stridor  Seprately Identifiable Procedures:  Prior to initiating any procedures, risks/benefits/alternatives were explained to the patient and verbal consent obtained. None  Impression & Plans:  Martha Mason is a 48 y.o. female with:  1. Lesion of mandible   2. 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 Will get CT Face to elucidate fully  - f/u 3 week phone visit  See below regarding exact medications prescribed this encounter including dosages and route: No orders of the defined types were placed in this encounter.     Thank you for allowing me the opportunity to care for your patient. Please do not hesitate to contact me should you have any other questions.  Sincerely, Eldora Blanch, MD Otolaryngologist (ENT), [facility] Phone: [phone] Fax: [phone]  02/22/2024, 3:45 PM   MDM:  Level 4 - [phone] Complexity/Problems addressed:  mod - undiagnosed new problem with uncertain prognosis Data complexity: mod - independent review of note, labs, ordering test - Morbidity: low  - Prescription Drug prescribed or managed: no

## 2024-02-22 NOTE — Patient Instructions (Signed)
 I have ordered an imaging study for you to complete prior to your next visit. Please call [facility] Scheduling at [phone] to schedule your imaging if you have not received a call within 24 hours. If you are unable to complete your imaging study prior to your next scheduled visit please call our office to let us  know.

## 2024-03-09 ENCOUNTER — Telehealth (INDEPENDENT_AMBULATORY_CARE_PROVIDER_SITE_OTHER): Payer: Self-pay | Admitting: Otolaryngology

## 2024-03-09 DIAGNOSIS — M279 Disease of jaws, unspecified: Secondary | ICD-10-CM

## 2024-03-09 NOTE — Telephone Encounter (Signed)
 Changed order.

## 2024-03-16 ENCOUNTER — Ambulatory Visit (INDEPENDENT_AMBULATORY_CARE_PROVIDER_SITE_OTHER): Admitting: Otolaryngology

## 2024-03-22 ENCOUNTER — Encounter (INDEPENDENT_AMBULATORY_CARE_PROVIDER_SITE_OTHER): Payer: Self-pay | Admitting: Otolaryngology

## 2024-03-24 ENCOUNTER — Ambulatory Visit
Admission: RE | Admit: 2024-03-24 | Discharge: 2024-03-24 | Disposition: A | Discharge status: Home / Self Care | Source: Ambulatory Visit | Attending: Otolaryngology | Admitting: Otolaryngology

## 2024-03-24 DIAGNOSIS — M279 Disease of jaws, unspecified: Secondary | ICD-10-CM | POA: Diagnosis not present

## 2024-03-24 MED ORDER — IOPAMIDOL (ISOVUE-300) INJECTION 61%
75.0000 mL | Freq: Once | INTRAVENOUS | Status: AC | PRN
  Administered 2024-03-24: 75 mL via INTRAVENOUS

## 2024-04-13 ENCOUNTER — Ambulatory Visit (INDEPENDENT_AMBULATORY_CARE_PROVIDER_SITE_OTHER): Admitting: Otolaryngology

## 2024-04-13 DIAGNOSIS — M270 Developmental disorders of jaws: Secondary | ICD-10-CM

## 2024-04-13 DIAGNOSIS — M279 Disease of jaws, unspecified: Secondary | ICD-10-CM

## 2024-04-13 NOTE — Progress Notes (Signed)
 Dear Dr. Alvera, Here is my assessment for our mutual patient, Martha Mason. Thank you for allowing me the opportunity to care for your patient. Please do not hesitate to contact me should you have any other questions. Sincerely, Dr. Eldora Blanch  Otolaryngology Clinic Note Referring provider: Dr. Alvera HPI:  Martha Mason is a 48 y.o. female kindly referred by Dr. Alvera for evaluation of mandibular lesion.  Initial visit (01/2024):  She got an routine Panoramic XR back in March 2025, and dentist recommend evaluation for it. PCP looked at it, and sent here for referral. She is currently using [employer]. She thinks it was on the right side. May have slight discomfort right parasymphysis area but this is inconsistent/rare. Patient otherwise denies: - dysphagia, odynophagia, unintentional weight loss - changes in voice, shortness of breath, hemoptysis - ear pain, neck masses, no dental numbness or odontalgia, loose teeth, no FOM numbness.   --------------------------------------------------------- 04/13/2024 Attempted to call, no answer; left VM 04/29/2024 Addendum: attempted to call, no answer; left VM   ENT Surgery: no Personal or FHx of bleeding dz or anesthesia difficulty: no  AP/AC: no  Tobacco: no  PMHx: Palpitations, MVP/MVR, GAD  Independent Review of Additional Tests or Records:  CMP 11/19/2023: BUN/Cr wnl, Alk Phos 89, Ca 8.8 CBC 01/29/2023: WBC 6.1, Plt 244 Martha Mason Referral notes reviewed and uploaded or available in chart in media tab (12/17/2023): noted mandibular lesion on panoramic XR in March, rec referral; Dx: Jaw lesion; Rx: ref to ENT CT Face 03/24/2024: no noted enhancing lesions on area of interest PMH/Meds/All/SocHx/FamHx/ROS:   Past Medical History:  Diagnosis Date   Anxiety    Endometrial polyp 06/30/2013   Head injuries    Closed head injury    Heart murmur    Kidney stone    Kidney stones    Meningitis    At age 11   Mitral valve prolapse     MVP (mitral valve prolapse)    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   Preeclampsia    Pregnancy induced hypertension    Retained placenta after delivery without hemorrhage but with other complication 12/25/2015   Retained products of conception, postpartum 01/16/2016   SVD (spontaneous vaginal delivery) 12/24/2015   Vertigo      Past Surgical History:  Procedure Laterality Date   CESAREAN SECTION N/A 10/22/2018   Procedure: CESAREAN SECTION;  Surgeon: Estelle Service, MD;  Location: [facility];  Service: Obstetrics;  Laterality: N/A;   COLONOSCOPY WITH PROPOFOL  N/A 03/22/2023   Procedure: COLONOSCOPY WITH PROPOFOL ;  Surgeon: Leigh Elspeth SQUIBB, MD;  Location: [facility];  Service: Gastroenterology;  Laterality: N/A;   DILATATION & CURETTAGE/HYSTEROSCOPY WITH TRUECLEAR N/A 06/30/2013   Procedure: DILATATION & HYSTEROSCOPY/POLYPECTOMY WITH TRUCLEAR;  Surgeon: Robbi JONELLE Render, MD;  Location: [facility];  Service: Gynecology;  Laterality: N/A;   DILATION AND CURETTAGE OF UTERUS N/A 12/24/2015   Procedure: DILATATION AND CURETTAGE;  Surgeon: Ezzie Buba, MD;  Location: [facility];  Service: Obstetrics;  Laterality: N/A;   DILATION AND EVACUATION N/A 01/16/2016   Procedure: DILATATION AND Evacuation with  ultrasound guide;  Surgeon: Ezzie Buba, MD;  Location: [facility];  Service: Gynecology;  Laterality: N/A;   NO PAST SURGERIES     RENAL ARTERY STENT Left 07/23/2012   uterine polyp      Family History  Problem Relation Age of Onset   Lung cancer Mother    Prostate cancer Father    Heart attack Father  Stroke Paternal Grandmother    Alzheimer's disease Paternal Grandfather    Colon cancer Neg Hx    Rectal cancer Neg Hx    Stomach cancer Neg Hx    Esophageal cancer Neg Hx    Pancreatic cancer Neg Hx    Liver cancer Neg Hx      Social Connections: Not on file      Current Outpatient Medications:    hyoscyamine (LEVSIN) 0.125  MG tablet, Take 0.125 mg by mouth 3 (three) times daily as needed for cramping., Disp: , Rfl:    metoprolol  tartrate (LOPRESSOR ) 25 MG tablet, Take 1 tablet (25 mg total) by mouth every 6 (six) hours as needed (For Rapid Heart Rate). Patient needs to make an appointment for further refills. 2nd attempt., Disp: 112 tablet, Rfl: 3   rosuvastatin  (CRESTOR ) 20 MG tablet, TAKE 1 TABLET BY MOUTH EVERY DAY, Disp: 90 tablet, Rfl: 2   sertraline  (ZOLOFT ) 50 MG tablet, Take 50 mg by mouth daily., Disp: , Rfl:    SUPREP BOWEL PREP KIT 17.5-3.13-1.6 GM/177ML SOLN, TAKE AS DIRECTED, Disp: 354 mL, Rfl: 0   Physical Exam:   There were no vitals taken for this visit.  Salient findings (prior, not today):  CN II-XII intact Bilateral EAC clear and TM intact with well pneumatized middle ear spaces Anterior rhinoscopy: Septum intact; bilateral inferior turbinates without significant hypertrophy No lesions of oral cavity/oropharynx; dentition good, no loose teeth; no oral numbness include FOM No obviously palpable neck masses/lymphadenopathy/thyromegaly No respiratory distress or stridor  Seprately Identifiable Procedures:  Prior to initiating any procedures, risks/benefits/alternatives were explained to the patient and verbal consent obtained. None  Impression & Plans:  Martha Mason is a 48 y.o. female with:  1. Lesion of mandible   2. Torus mandibularis    Did not answer phone visit.  See below regarding exact medications prescribed this encounter including dosages and route: No orders of the defined types were placed in this encounter.     Thank you for allowing me the opportunity to care for your patient. Please do not hesitate to contact me should you have any other questions.  Sincerely, Eldora Blanch, MD Otolaryngologist (ENT), [facility] Phone: [phone] Fax: [phone]  04/29/2024, 9:57 AM   MDM:  Level 4 - [phone] Complexity/Problems addressed: mod -  undiagnosed new problem with uncertain prognosis Data complexity: mod - independent review of note, labs, ordering test - Morbidity: low  - Prescription Drug prescribed or managed: no

## 2024-04-14 ENCOUNTER — Telehealth (INDEPENDENT_AMBULATORY_CARE_PROVIDER_SITE_OTHER): Payer: Self-pay | Admitting: Otolaryngology

## 2024-04-14 NOTE — Telephone Encounter (Signed)
 Patient called in and left voice mail wanting to know the results of the CT.  Please advise.  Thank you.

## 2024-05-22 ENCOUNTER — Encounter: Payer: Self-pay | Admitting: Nurse Practitioner

## 2024-05-22 ENCOUNTER — Other Ambulatory Visit (HOSPITAL_COMMUNITY): Payer: Self-pay

## 2024-05-22 ENCOUNTER — Ambulatory Visit: Attending: Nurse Practitioner | Admitting: Nurse Practitioner

## 2024-05-22 VITALS — BP 136/84 | HR 92 | Ht 69.0 in | Wt 181.0 lb

## 2024-05-22 DIAGNOSIS — Z8759 Personal history of other complications of pregnancy, childbirth and the puerperium: Secondary | ICD-10-CM

## 2024-05-22 DIAGNOSIS — R0609 Other forms of dyspnea: Secondary | ICD-10-CM

## 2024-05-22 DIAGNOSIS — Z8249 Family history of ischemic heart disease and other diseases of the circulatory system: Secondary | ICD-10-CM | POA: Diagnosis not present

## 2024-05-22 DIAGNOSIS — R002 Palpitations: Secondary | ICD-10-CM

## 2024-05-22 DIAGNOSIS — I340 Nonrheumatic mitral (valve) insufficiency: Secondary | ICD-10-CM | POA: Diagnosis not present

## 2024-05-22 DIAGNOSIS — O1400 Mild to moderate pre-eclampsia, unspecified trimester: Secondary | ICD-10-CM

## 2024-05-22 DIAGNOSIS — E782 Mixed hyperlipidemia: Secondary | ICD-10-CM

## 2024-05-22 MED ORDER — METOPROLOL TARTRATE 100 MG PO TABS
ORAL_TABLET | ORAL | 0 refills | Status: AC
  Filled 2024-05-22: qty 1, 1d supply, fill #0

## 2024-05-22 NOTE — Patient Instructions (Signed)
 Medication Instructions:  Your physician recommends that you continue on your current medications as directed. Please refer to the Current Medication list given to you today.  *If you need a refill on your cardiac medications before your next appointment, please call your pharmacy*  Lab Work: Lpa, BMET today  Testing/Procedures:   Your cardiac CT will be scheduled at one of the below locations:   [facility] [address]  [place], [place] [address]   If scheduled at the [facility] at [address], please enter the parking lot using the [address] entrance and use the FREE valet service at the patient drop-off area. Enter the building and check-in with registration on the main floor.   Please follow these instructions carefully (unless otherwise directed):  An IV will be required for this test and Nitroglycerin  will be given.  Hold all erectile dysfunction medications at least 3 days (72 hrs) prior to test. (Ie viagra, cialis, sildenafil, tadalafil, etc)   On the Night Before the Test: Be sure to Drink plenty of water. Do not consume any caffeinated/decaffeinated beverages or chocolate 12 hours prior to your test. Do not take any antihistamines 12 hours prior to your test.  On the Day of the Test: Drink plenty of water until 1 hour prior to the test. Do not eat any food 1 hour prior to test. You may take your regular medications prior to the test.  Take metoprolol  100 mg (Lopressor ) two hours prior to test. Medication was sent to your pharmacy.  If you take Furosemide/Hydrochlorothiazide/Spironolactone/Chlorthalidone, please HOLD on the morning of the test. Patients who wear a continuous glucose monitor MUST remove the device prior to scanning. FEMALES- please wear underwire-free bra if available, avoid dresses & tight clothing         After the Test: Drink plenty of water. After receiving IV contrast, you may experience a  mild flushed feeling. This is normal. On occasion, you may experience a mild rash up to 24 hours after the test. This is not dangerous. If this occurs, you can take Benadryl  25 mg, Zyrtec, Claritin, or Allegra and increase your fluid intake. (Patients taking Tikosyn should avoid Benadryl , and may take Zyrtec, Claritin, or Allegra) If you experience trouble breathing, this can be serious. If it is severe call 911 IMMEDIATELY. If it is mild, please call our office.  We will call to schedule your test 2-4 weeks out understanding that some insurance companies will need an authorization prior to the service being performed.   For more information and frequently asked questions, please visit our website : [email or web address]  For non-scheduling related questions, please contact the cardiac imaging nurse navigator should you have any questions/concerns: Cardiac Imaging Nurse Navigators Direct Office Dial: [phone]   For scheduling needs, including cancellations and rescheduling, please call Brittany, [phone].   Follow-Up: At [facility], you and your health needs are our priority.  As part of our continuing mission to provide you with exceptional heart care, our providers are all part of one team.  This team includes your primary Cardiologist (physician) and Advanced Practice Providers or APPs (Physician Assistants and Nurse Practitioners) who all work together to provide you with the care you need, when you need it.  Your next appointment:   2-3 month(s)  Provider:   Damien Braver, NP          We recommend signing up for the patient portal called MyChart.  Sign up information is  provided on this After Visit Summary.  MyChart is used to connect with patients for Virtual Visits (Telemedicine).  Patients are able to view lab/test results, encounter notes, upcoming appointments, etc.  Non-urgent messages can be sent to your provider as well.   To learn more about what  you can do with MyChart, go to [email or web address].

## 2024-05-22 NOTE — Progress Notes (Signed)
 "  Office Visit    Patient Name: Martha Mason Date of Encounter: 05/22/2024  Primary Care Provider:  Alvera Reagin, PA Primary Cardiologist:  Redell Leiter, MD  Chief Complaint    48 year old female with a history of palpitations, family history of premature CAD,  pregnancy-induced hypertension, hyperlipidemia, mitral valve prolapse/mitral valve regurgitation, diverticulosis, and anxiety who presents for follow-up related to palpitations and hypertension.   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   Preeclampsia    Pregnancy induced hypertension    Retained placenta after delivery without hemorrhage but with other complication 12/25/2015   Retained products of conception, postpartum 01/16/2016   SVD (spontaneous vaginal delivery) 12/24/2015   Vertigo    Past Surgical History:  Procedure Laterality Date   CESAREAN SECTION N/A 10/22/2018   Procedure: CESAREAN SECTION;  Surgeon: Estelle Service, MD;  Location: [facility];  Service: Obstetrics;  Laterality: N/A;   COLONOSCOPY WITH PROPOFOL  N/A 03/22/2023   Procedure: COLONOSCOPY WITH PROPOFOL ;  Surgeon: Leigh Elspeth SQUIBB, MD;  Location: [facility];  Service: Gastroenterology;  Laterality: N/A;   DILATATION & CURETTAGE/HYSTEROSCOPY WITH TRUECLEAR N/A 06/30/2013   Procedure: DILATATION & HYSTEROSCOPY/POLYPECTOMY WITH TRUCLEAR;  Surgeon: Robbi JONELLE Render, MD;  Location: [facility];  Service: Gynecology;  Laterality: N/A;   DILATION AND CURETTAGE OF UTERUS N/A 12/24/2015   Procedure: DILATATION AND CURETTAGE;  Surgeon: Ezzie Buba, MD;  [facility]: [facility];  Service: Obstetrics;  Laterality: N/A;   DILATION AND  EVACUATION N/A 01/16/2016   Procedure: DILATATION AND Evacuation with  ultrasound guide;  Surgeon: Ezzie Buba, MD;  Location: [facility];  Service: Gynecology;  Laterality: N/A;   NO PAST SURGERIES     RENAL ARTERY STENT Left 07/23/2012   uterine polyp      Allergies  Allergies[1]   Labs/Other Studies Reviewed    The following studies were reviewed today:  Cardiac Studies & Procedures   ______________________________________________________________________________________________     ECHOCARDIOGRAM  ECHOCARDIOGRAM COMPLETE 01/07/2024  Narrative ECHOCARDIOGRAM REPORT    Patient Name:   Martha Mason Date of Exam: 01/07/2024 Medical Rec #:  [id number]         Height:       69.0 in Accession #:    [id number]        Weight:       176.2 lb Date of Birth:  1975/10/16         BSA:          1.958 m Patient Age:    48 years          BP:           132/76 mmHg Patient Gender: F                 HR:           74 bpm. Exam Location:  [address]  Procedure: 2D Echo, 3D Echo, Cardiac Doppler and Color Doppler (Both Spectral and Color Flow Doppler were utilized during procedure).  Indications:    R00.2 Palpitations  History:  Patient has prior history of Echocardiogram examinations, most recent 12/23/2018. Mitral Valve Disease and Mitral Valve Prolapse, Signs/Symptoms:Murmur and Dizziness/Lightheadedness; Risk Factors:Family History of Coronary Artery Disease.  Sonographer:    Heather Hawks RDCS Referring Phys: Harlyn Italiano C Jaedyn Lard  IMPRESSIONS   1. Left ventricular ejection fraction, by estimation, is 60 to 65%. The left ventricle has normal function. The left ventricle has no regional wall motion abnormalities. Left ventricular diastolic parameters were normal. 2. Right ventricular systolic function is normal. The right ventricular size is normal. 3. The mitral valve is normal in structure. Trivial mitral valve regurgitation. No evidence of mitral stenosis. 4. The  aortic valve is tricuspid. Aortic valve regurgitation is not visualized. No aortic stenosis is present. 5. The inferior vena cava is normal in size with greater than 50% respiratory variability, suggesting right atrial pressure of 3 mmHg.  FINDINGS Left Ventricle: Left ventricular ejection fraction, by estimation, is 60 to 65%. The left ventricle has normal function. The left ventricle has no regional wall motion abnormalities. The left ventricular internal cavity size was normal in size. There is no left ventricular hypertrophy. Left ventricular diastolic parameters were normal.  Right Ventricle: The right ventricular size is normal. Right ventricular systolic function is normal.  Left Atrium: Left atrial size was normal in size.  Right Atrium: Right atrial size was normal in size.  Pericardium: There is no evidence of pericardial effusion.  Mitral Valve: The mitral valve is normal in structure. Trivial mitral valve regurgitation. No evidence of mitral valve stenosis.  Tricuspid Valve: The tricuspid valve is normal in structure. Tricuspid valve regurgitation is trivial. No evidence of tricuspid stenosis.  Aortic Valve: The aortic valve is tricuspid. Aortic valve regurgitation is not visualized. No aortic stenosis is present.  Pulmonic Valve: The pulmonic valve was normal in structure. Pulmonic valve regurgitation is not visualized. No evidence of pulmonic stenosis.  Aorta: The aortic root is normal in size and structure.  Venous: The inferior vena cava is normal in size with greater than 50% respiratory variability, suggesting right atrial pressure of 3 mmHg.  IAS/Shunts: No atrial level shunt detected by color flow Doppler.  Additional Comments: 3D was performed not requiring image post processing on an independent workstation and was normal.   LEFT VENTRICLE PLAX 2D LVIDd:         4.60 cm   Diastology LVIDs:         2.80 cm   LV e' medial:    10.70 cm/s LV PW:         1.00 cm    LV E/e' medial:  9.6 LV IVS:        0.70 cm   LV e' lateral:   11.40 cm/s LVOT diam:     2.00 cm   LV E/e' lateral: 9.0 LV SV:         58 LV SV Index:   29 LVOT Area:     3.14 cm  3D Volume EF: 3D EF:        66 % LV EDV:       121 ml LV ESV:       41 ml LV SV:        81 ml  RIGHT VENTRICLE RV Basal diam:  3.70 cm RV S prime:     16.80 cm/s TAPSE (M-mode): 2.9 cm RVSP:           20.5 mmHg  LEFT ATRIUM  Index        RIGHT ATRIUM           Index LA diam:        4.80 cm 2.45 cm/m   RA Pressure: 3.00 mmHg LA Vol (A2C):   79.7 ml 40.71 ml/m  RA Area:     14.60 cm LA Vol (A4C):   56.1 ml 28.66 ml/m  RA Volume:   42.80 ml  21.86 ml/m LA Biplane Vol: 69.0 ml 35.25 ml/m AORTIC VALVE LVOT Vmax:   85.25 cm/s LVOT Vmean:  58.050 cm/s LVOT VTI:    0.184 m  AORTA Ao Root diam: 2.50 cm Ao Asc diam:  2.50 cm  MITRAL VALVE                TRICUSPID VALVE MV Area (PHT)  cm          TR Peak grad:   17.5 mmHg MV Decel Time: 163 msec     TR Vmax:        209.00 cm/s MR Peak grad: 152.8 mmHg    Estimated RAP:  3.00 mmHg MR Mean grad: 96.0 mmHg     RVSP:           20.5 mmHg MR Vmax:      618.00 cm/s MR Vmean:     464.0 cm/s    SHUNTS MV E velocity: 102.50 cm/s  Systemic VTI:  0.18 m MV A velocity: 69.70 cm/s   Systemic Diam: 2.00 cm MV E/A ratio:  1.47  Redell Shallow MD Electronically signed by Redell Shallow MD Signature Date/Time: 01/07/2024/6:07:26 PM    Final          ______________________________________________________________________________________________     Recent Labs: 11/19/2023: BUN 12; Creatinine, Ser 0.83; Potassium 4.7; Sodium 141 02/14/2024: ALT 20  Recent Lipid Panel    Component Value Date/Time   CHOL 150 02/14/2024 1034   TRIG 77 02/14/2024 1034   HDL 45 02/14/2024 1034   CHOLHDL 3.3 02/14/2024 1034   LDLCALC 90 02/14/2024 1034    History of Present Illness    48 year old female with the above past medical history including  palpitations, family history of premature CAD,  pregnancy-induced hypertension, hyperlipidemia, mitral valve prolapse/mitral valve regurgitation, diverticulosis, and anxiety.   She has a history of pregnancy-induced hypertension/preeclampsia (last delivery was in 10/2018).  She was referred to cardiology in 11/2018 in the setting of palpitations. Echocardiogram in 11/2018 showed EF 60 to 65%, normal LV function, no RWMA, RV systolic function, trivial mitral valve regurgitation, otherwise, no significant valvular disease.  Palpitations were noted to be primarily associated with stress, managed with as needed metoprolol . BP was stable. Patient was last seen in the office on 11/19/2023 and was stable from a cardiac standpoint. She works as a Clinical Biochemist.  She reported increased palpitations towards the end of the school year.  It was again noted that her symptoms were primarily associated with stress/anxiety.  Echocardiogram in 12/2023 showed EF 60 to 65%, normal LV function, no RWMA, no significant valvular abnormalities. Escalation of medical therapy was deferred.   She presents today for follow-up.  Since her last visit she has been stable from a cardiac standpoint.  She reports occasional dyspnea on exertion, gradual weight gain. She denies pain, palpitations, dizziness, presyncope, syncope, edema, PND, orthopnea, weight gain.  Overall, she reports feeling well.    Home Medications    Current Outpatient Medications  Medication Sig Dispense Refill   metoprolol  tartrate (LOPRESSOR ) 25 MG  tablet Take 1 tablet (25 mg total) by mouth every 6 (six) hours as needed (For Rapid Heart Rate). Patient needs to make an appointment for further refills. 2nd attempt. 112 tablet 3   rosuvastatin  (CRESTOR ) 20 MG tablet TAKE 1 TABLET BY MOUTH EVERY DAY 90 tablet 2   sertraline  (ZOLOFT ) 50 MG tablet Take 50 mg by mouth daily.     hyoscyamine (LEVSIN) 0.125 MG tablet Take 0.125 mg by mouth 3 (three) times  daily as needed for cramping. (Patient not taking: Reported on 05/22/2024)     SUPREP BOWEL PREP KIT 17.5-3.13-1.6 GM/177ML SOLN TAKE AS DIRECTED (Patient not taking: Reported on 05/22/2024) 354 mL 0   No current facility-administered medications for this visit.     Review of Systems    She denies chest pain, palpitations, dyspnea, pnd, orthopnea, n, v, dizziness, syncope, edema, weight gain, or early satiety. All other systems reviewed and are otherwise negative except as noted above.   Physical Exam    VS:  BP 136/84   Pulse 92   Ht 5' 9 (1.753 m)   Wt 181 lb (82.1 kg)   SpO2 97%   BMI 26.73 kg/m   GEN: Well nourished, well developed, in no acute distress. HEENT: normal. Neck: Supple, no JVD, carotid bruits, or masses. Cardiac: RRR, no murmurs, rubs, or gallops. No clubbing, cyanosis, edema.  Radials/DP/PT 2+ and equal bilaterally.  Respiratory:  Respirations regular and unlabored, clear to auscultation bilaterally. GI: Soft, nontender, nondistended, BS + x 4. MS: no deformity or atrophy. Skin: warm and dry, no rash. Neuro:  Strength and sensation are intact. Psych: Normal affect.  Accessory Clinical Findings    ECG personally reviewed by me today -    - no EKG in office today.   Lab Results  Component Value Date   WBC 6.1 01/29/2023   HGB 14.9 01/29/2023   HCT 45.2 01/29/2023   MCV 94.3 01/29/2023   PLT 244.0 01/29/2023   Lab Results  Component Value Date   CREATININE 0.83 11/19/2023   BUN 12 11/19/2023   NA 141 11/19/2023   K 4.7 11/19/2023   CL 105 11/19/2023   CO2 20 11/19/2023   Lab Results  Component Value Date   ALT 20 02/14/2024   AST 18 02/14/2024   ALKPHOS 89 02/14/2024   BILITOT 0.7 02/14/2024   Lab Results  Component Value Date   CHOL 150 02/14/2024   HDL 45 02/14/2024   LDLCALC 90 02/14/2024   TRIG 77 02/14/2024   CHOLHDL 3.3 02/14/2024    No results found for: HGBA1C  Assessment & Plan   1. Palpitations:  Echocardiogram in  12/2023 showed EF 60 to 65%, normal LV function, no RWMA, no significant valvular abnormalities.  She denies any recent palpitations.  Continue metoprolol  as needed.   2. Family history of CAD/dyspnea on exertion: She shares that her father had a heart attack in his early 33s.  She reports intermittent dyspnea on exertion, denies chest pain.  Euvolemic and well compensated on exam. Recent echo stable as above. Through shared decision making, and for further risk stratification, will pursue coronary CT angiogram. Will update BMET, LP(a).  Continue Crestor .  3. Mitral valve prolapse/mitral valve regurgitation: She has a reported history of mitral valve prolapse.  Echocardiogram in 12/2023 showed EF 60 to 65%, normal LV function, no RWMA, no significant valvular abnormalities.  Consider repeat echocardiogram as clinically indicated.   4. Pregnancy-induced hypertension: BP has been stable overall. She is not  on any antihypertensive medication at this time. Continue to monitor BP and report because of the greater than 130/80.   5. Hyperlipidemia: LDL was 90 in 01/2024. Pending CT results, lab results, consider escalation of statin therapy, will defer for now.  Continue Crestor .  6. Disposition: Follow-up in 2-3 months.         Damien JAYSON Braver, NP 05/22/2024, 10:44 AM       [1]  Allergies Allergen Reactions   Hydrocodone Other (See Comments)    Skin crawls   Oxycodone      Feels like bugs crawling on her    Keflex [Cephalexin] Palpitations    Patient unsure of allergy    Tape Rash    Red splotches   "

## 2024-05-23 LAB — LIPOPROTEIN A (LPA): Lipoprotein (a): 202.3 nmol/L — AB

## 2024-05-23 LAB — BASIC METABOLIC PANEL WITH GFR
BUN/Creatinine Ratio: 21 (ref 9–23)
BUN: 17 mg/dL (ref 6–24)
CO2: 22 mmol/L (ref 20–29)
Calcium: 9 mg/dL (ref 8.7–10.2)
Chloride: 106 mmol/L (ref 96–106)
Creatinine, Ser: 0.8 mg/dL (ref 0.57–1.00)
Glucose: 76 mg/dL (ref 70–99)
Potassium: 4.5 mmol/L (ref 3.5–5.2)
Sodium: 141 mmol/L (ref 134–144)
eGFR: 90 mL/min/1.73

## 2024-05-31 ENCOUNTER — Ambulatory Visit: Payer: Self-pay | Admitting: Nurse Practitioner

## 2024-06-08 ENCOUNTER — Encounter (HOSPITAL_COMMUNITY): Payer: Self-pay

## 2024-06-12 ENCOUNTER — Ambulatory Visit (HOSPITAL_COMMUNITY)
Admission: RE | Admit: 2024-06-12 | Discharge: 2024-06-12 | Disposition: A | Discharge status: Home / Self Care | Source: Ambulatory Visit | Attending: Internal Medicine | Admitting: Internal Medicine

## 2024-06-12 DIAGNOSIS — Z8249 Family history of ischemic heart disease and other diseases of the circulatory system: Secondary | ICD-10-CM | POA: Diagnosis not present

## 2024-06-12 DIAGNOSIS — R0609 Other forms of dyspnea: Secondary | ICD-10-CM | POA: Insufficient documentation

## 2024-06-12 DIAGNOSIS — R9430 Abnormal result of cardiovascular function study, unspecified: Secondary | ICD-10-CM

## 2024-06-12 MED ORDER — IOHEXOL 350 MG/ML SOLN
100.0000 mL | Freq: Once | INTRAVENOUS | Status: AC | PRN
  Administered 2024-06-12: 100 mL via INTRAVENOUS

## 2024-06-12 MED ORDER — NITROGLYCERIN 0.4 MG SL SUBL
0.8000 mg | SUBLINGUAL_TABLET | Freq: Once | SUBLINGUAL | Status: AC
  Administered 2024-06-12: 0.8 mg via SUBLINGUAL

## 2024-06-23 NOTE — Telephone Encounter (Signed)
 Lmom to discuss calcium  score test results. Waiting on a return call if pt isn't able to review results in mychart.

## 2024-07-25 ENCOUNTER — Ambulatory Visit: Admitting: Nurse Practitioner
